# Patient Record
Sex: Female | Born: 1985 | Race: Black or African American | Hispanic: No | Marital: Single | Smoking: Never smoker
Health system: Southern US, Community
[De-identification: ages and names within clinical notes are randomized; demographics above are authoritative.]

## PROBLEM LIST (undated history)

## (undated) DIAGNOSIS — R519 Headache, unspecified: Secondary | ICD-10-CM

## (undated) DIAGNOSIS — R51 Headache: Secondary | ICD-10-CM

## (undated) HISTORY — DX: Headache, unspecified: R51.9

## (undated) HISTORY — DX: Headache: R51

---
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
## 2005-01-05 ENCOUNTER — Emergency Department (HOSPITAL_COMMUNITY): Admission: EM | Admit: 2005-01-05 | Discharge: 2005-01-05 | Payer: Self-pay | Admitting: Emergency Medicine

## 2008-09-05 ENCOUNTER — Other Ambulatory Visit: Admission: RE | Admit: 2008-09-05 | Discharge: 2008-09-05 | Payer: Self-pay | Admitting: Obstetrics and Gynecology

## 2009-01-19 ENCOUNTER — Ambulatory Visit: Payer: Self-pay | Admitting: Obstetrics & Gynecology

## 2009-01-19 ENCOUNTER — Inpatient Hospital Stay (HOSPITAL_COMMUNITY): Admission: AD | Admit: 2009-01-19 | Discharge: 2009-01-21 | Payer: Self-pay | Admitting: Obstetrics and Gynecology

## 2009-09-21 ENCOUNTER — Other Ambulatory Visit: Admission: RE | Admit: 2009-09-21 | Discharge: 2009-09-21 | Payer: Self-pay | Admitting: Obstetrics and Gynecology

## 2010-06-17 ENCOUNTER — Encounter: Payer: Self-pay | Admitting: Family Medicine

## 2010-09-01 LAB — CBC
HCT: 33.3 % — ABNORMAL LOW (ref 36.0–46.0)
Hemoglobin: 10.8 g/dL — ABNORMAL LOW (ref 12.0–15.0)
Hemoglobin: 11.2 g/dL — ABNORMAL LOW (ref 12.0–15.0)
MCHC: 32.5 g/dL (ref 30.0–36.0)
MCV: 87.9 fL (ref 78.0–100.0)
Platelets: 203 10*3/uL (ref 150–400)
RBC: 3.92 MIL/uL (ref 3.87–5.11)
RDW: 13.5 % (ref 11.5–15.5)
WBC: 23 10*3/uL — ABNORMAL HIGH (ref 4.0–10.5)

## 2011-01-09 ENCOUNTER — Encounter: Payer: Self-pay | Admitting: *Deleted

## 2011-01-09 ENCOUNTER — Emergency Department (HOSPITAL_COMMUNITY)
Admission: EM | Admit: 2011-01-09 | Discharge: 2011-01-09 | Disposition: A | Discharge status: Home / Self Care | Payer: Medicaid Other | Attending: Emergency Medicine | Admitting: Emergency Medicine

## 2011-01-09 DIAGNOSIS — G43909 Migraine, unspecified, not intractable, without status migrainosus: Secondary | ICD-10-CM | POA: Insufficient documentation

## 2011-01-09 MED ORDER — DIPHENHYDRAMINE HCL 50 MG/ML IJ SOLN
50.0000 mg | Freq: Once | INTRAMUSCULAR | Status: AC
  Administered 2011-01-09: 50 mg via INTRAMUSCULAR
  Filled 2011-01-09: qty 1

## 2011-01-09 MED ORDER — KETOROLAC TROMETHAMINE 60 MG/2ML IM SOLN
60.0000 mg | Freq: Once | INTRAMUSCULAR | Status: AC
  Administered 2011-01-09: 60 mg via INTRAMUSCULAR
  Filled 2011-01-09: qty 2

## 2011-01-09 MED ORDER — METOCLOPRAMIDE HCL 5 MG/ML IJ SOLN
10.0000 mg | Freq: Once | INTRAMUSCULAR | Status: AC
  Administered 2011-01-09: 10 mg via INTRAMUSCULAR
  Filled 2011-01-09: qty 2

## 2011-01-09 NOTE — ED Notes (Signed)
Pt reports hx of migraines and tonight she has been unable to tolerate her headache pain

## 2011-01-09 NOTE — ED Provider Notes (Signed)
History     CSN: [id number] Arrival date & time: 01/09/2011  9:46 PM  Chief Complaint  Patient presents with  . Headache   HPI Pt was seen at 2205. Per pt, c/o gradual onset and persistence of constant acute flair of her chronic migraine headache x2 days.  Has been assoc with nausea.  Describes the headache as per her usual chronic migraine headache pain pattern x2 years.  Denies headache was sudden or maximal in onset or at any time.  Denies visual changes, no focal motor weakness, no tingling/numbness in extremities, no fevers, no neck pain, no rash.     Past Medical History  Diagnosis Date  . Migraine     History reviewed. No pertinent past surgical history.  No family history on file.  History  Substance Use Topics  . Smoking status: Never Smoker   . Smokeless tobacco: Not on file  . Alcohol Use: No    OB History    Grav Para Term Preterm Abortions TAB SAB Ect Mult Living                  Review of Systems ROS: Statement: All systems negative except as marked or noted in the HPI; Constitutional: Negative for fever and chills. ; ; Eyes: Negative for eye pain, redness and discharge. ; ; ENMT: Negative for ear pain, hoarseness, nasal congestion, sinus pressure and sore throat. ; ; Cardiovascular: Negative for chest pain, palpitations, diaphoresis, dyspnea and peripheral edema. ; ; Respiratory: Negative for cough, wheezing and stridor. ; ; Gastrointestinal: +nausea.  Negative for vomiting, diarrhea and abdominal pain, blood in stool, hematemesis, jaundice and rectal bleeding. . ; ; Genitourinary: Negative for dysuria, flank pain and hematuria. ; ; Musculoskeletal: Negative for back pain and neck pain. Negative for swelling and trauma.; ; Skin: Negative for pruritus, rash, abrasions, blisters, bruising and skin lesion.; ; Neuro: +migraine headache.  Negative for lightheadedness and neck stiffness. Negative for weakness, altered level of consciousness , altered mental status,  extremity weakness, paresthesias, involuntary movement, seizure and syncope.       Physical Exam  BP 122/91  Pulse 82  Temp(Src) 98.3 F (36.8 C) (Oral)  Resp 20  Ht 5\' 2"  (1.575 m)  Wt 187 lb (84.823 kg)  BMI 34.20 kg/m2  SpO2 100%  LMP 12/21/2010  Physical Exam 2210: Physical examination:  Nursing notes reviewed; Vital signs and O2 SAT reviewed;  Constitutional: Well developed, Well nourished, Well hydrated, In no acute distress; Head:  Normocephalic, atraumatic; Eyes: EOMI, PERRL, No scleral icterus; ENMT: TM's clear bilat, Mouth and pharynx normal, Mucous membranes moist; Neck: Supple, Full range of motion, No lymphadenopathy, no meningeal signs; Cardiovascular: Regular rate and rhythm, No murmur, rub, or gallop; Respiratory: Breath sounds clear & equal bilaterally, No rales, rhonchi, wheezes, or rub, Normal respiratory effort/excursion; Chest: Nontender, Movement normal; Abdomen: Soft, Nontender, Nondistended, Normal bowel sounds;  Extremities: Pulses normal, No tenderness, No edema, No calf edema or asymmetry.; Neuro: AA&Ox3, Speech clear, no facial droop.  Major CN grossly intact.  No gross focal motor or sensory deficits in extremities.  Gait steady.; Skin: Color normal, Warm, Dry.   ED Course  Procedures  MDM MDM Reviewed: nursing note and vitals     Usual migraine h/a.  Encouraged to keep a headache diary, f/u PMD.  Will tx IM meds.   Lukah Goswami Allison Quarry, DO 01/10/11 1927

## 2011-12-11 DIAGNOSIS — S61409A Unspecified open wound of unspecified hand, initial encounter: Secondary | ICD-10-CM | POA: Insufficient documentation

## 2011-12-11 DIAGNOSIS — W268XXA Contact with other sharp object(s), not elsewhere classified, initial encounter: Secondary | ICD-10-CM | POA: Insufficient documentation

## 2011-12-12 ENCOUNTER — Emergency Department (HOSPITAL_COMMUNITY)
Admission: EM | Admit: 2011-12-12 | Discharge: 2011-12-12 | Disposition: A | Discharge status: Home / Self Care | Payer: Medicaid Other | Attending: Emergency Medicine | Admitting: Emergency Medicine

## 2011-12-12 ENCOUNTER — Encounter (HOSPITAL_COMMUNITY): Payer: Self-pay | Admitting: Emergency Medicine

## 2011-12-12 DIAGNOSIS — IMO0002 Reserved for concepts with insufficient information to code with codable children: Secondary | ICD-10-CM

## 2011-12-12 MED ORDER — TETANUS-DIPHTH-ACELL PERTUSSIS 5-2.5-18.5 LF-MCG/0.5 IM SUSP
0.5000 mL | Freq: Once | INTRAMUSCULAR | Status: AC
  Administered 2011-12-12: 0.5 mL via INTRAMUSCULAR
  Filled 2011-12-12: qty 0.5

## 2011-12-12 NOTE — ED Notes (Signed)
No adverse reaction noted to tetanus shot.

## 2011-12-12 NOTE — ED Provider Notes (Addendum)
History     CSN: [id number]  Arrival date & time 12/11/11  2355   First MD Initiated Contact with Patient 12/12/11 0101      Chief Complaint  Patient presents with  . Laceration    (Consider location/radiation/quality/duration/timing/severity/associated sxs/prior treatment) HPI  Tina Larsen is a 26 y.o. female who presents to the Emergency Department complaining of laceration to right secondt MCP from a glass that broke while washing. Bleeding has been controlled.   Past Medical History  Diagnosis Date  . Migraine     No past surgical history on file.  No family history on file.  History  Substance Use Topics  . Smoking status: Never Smoker   . Smokeless tobacco: Not on file  . Alcohol Use: No    OB History    Grav Para Term Preterm Abortions TAB SAB Ect Mult Living                  Review of Systems  Constitutional: Negative for fever.       10 Systems reviewed and are negative for acute change except as noted in the HPI.  HENT: Negative for congestion.   Eyes: Negative for discharge and redness.  Respiratory: Negative for cough and shortness of breath.   Cardiovascular: Negative for chest pain.  Gastrointestinal: Negative for vomiting and abdominal pain.  Musculoskeletal: Negative for back pain.  Skin: Negative for rash.       Laceration to2nd mcp  Neurological: Negative for syncope, numbness and headaches.  Psychiatric/Behavioral:       No behavior change.    Allergies  Review of patient's allergies indicates no known allergies.  Home Medications  No current outpatient prescriptions on file.  BP 114/70  Pulse 62  Temp 98.6 F (37 C) (Oral)  Resp 18  Ht 5\' 2"  (1.575 m)  Wt 190 lb (86.183 kg)  BMI 34.75 kg/m2  SpO2 98%  LMP 11/10/2011  Physical Exam  Nursing note and vitals reviewed. Constitutional:       Awake, alert, nontoxic appearance.  HENT:  Head: Atraumatic.  Eyes: Right eye exhibits no discharge. Left eye exhibits no  discharge.  Neck: Neck supple.  Cardiovascular: Normal heart sounds.   Pulmonary/Chest: Effort normal and breath sounds normal. She exhibits no tenderness.  Abdominal: Soft. There is no tenderness. There is no rebound.  Musculoskeletal: She exhibits no tenderness.       Baseline ROM, no obvious new focal weakness.  Neurological:       Mental status and motor strength appears baseline for patient and situation.  Skin: No rash noted.       Flap laceration  C shaped, 2 cm overlying 2nd mcp right hand. Bleeding controlled.  Psychiatric: She has a normal mood and affect.    ED Course  Procedures (including critical care time)  LACERATION REPAIR Performed by: Annamarie Dawley. Authorized by: Annamarie Dawley Consent: Verbal consent obtained. Risks and benefits: risks, benefits and alternatives were discussed Consent given by: patient Patient identity confirmed: provided demographic data Prepped and Draped in normal sterile fashion Wound explored  Laceration Location: 2nd mcp right hand Laceration Length: 2cm No Foreign Bodies seen or palpated AIrrigation method: syringe Amount of cleaning: standard Skin closure: dermabond and steri strips  Patient tolerance: Patient tolerated the procedure well with no immediate complications.  MDM  Patient with flap laceration to right 2nd mcp from a broken drinking glass. Patient was given tetanus update. Laceration repaired with dermabond and steri strips. Pt  stable in ED with no significant deterioration in condition.The patient appears reasonably screened and/or stabilized for discharge and I doubt any other medical condition or other [facility] requiring further screening, evaluation, or treatment in the ED at this time prior to discharge.  MDM Reviewed: nursing note and vitals           Nicoletta Dress. Colon Branch, MD 12/12/11 1610  Nicoletta Dress. Colon Branch, MD 12/12/11 [phone]

## 2011-12-12 NOTE — ED Notes (Signed)
Patient states she was washing a glass and it broke; presents to ER with laceration to knuckle of right index finger.

## 2014-07-20 ENCOUNTER — Emergency Department (HOSPITAL_COMMUNITY)
Admission: EM | Admit: 2014-07-20 | Discharge: 2014-07-20 | Discharge status: Against Medical Advice | Payer: Medicaid Other | Attending: Emergency Medicine | Admitting: Emergency Medicine

## 2014-07-20 ENCOUNTER — Emergency Department (HOSPITAL_COMMUNITY): Payer: Medicaid Other

## 2014-07-20 ENCOUNTER — Encounter (HOSPITAL_COMMUNITY): Payer: Self-pay | Admitting: *Deleted

## 2014-07-20 DIAGNOSIS — Y92410 Unspecified street and highway as the place of occurrence of the external cause: Secondary | ICD-10-CM | POA: Insufficient documentation

## 2014-07-20 DIAGNOSIS — S3991XA Unspecified injury of abdomen, initial encounter: Secondary | ICD-10-CM | POA: Insufficient documentation

## 2014-07-20 DIAGNOSIS — Y998 Other external cause status: Secondary | ICD-10-CM | POA: Insufficient documentation

## 2014-07-20 DIAGNOSIS — Y9389 Activity, other specified: Secondary | ICD-10-CM | POA: Insufficient documentation

## 2014-07-20 DIAGNOSIS — Z8659 Personal history of other mental and behavioral disorders: Secondary | ICD-10-CM | POA: Insufficient documentation
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
## 2014-07-20 NOTE — ED Notes (Signed)
No redness or bruising noted across abdomen. Abdomen is soft w/ no rigidity or tenderness..Marland Kitchen
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
## 2014-07-20 NOTE — ED Notes (Signed)
Dr. Clarene DukeMcmanus in room to do ultrasound.  Pt not in room.

## 2015-10-05 ENCOUNTER — Ambulatory Visit: Payer: Medicaid Other | Admitting: Family Medicine

## 2015-10-12 ENCOUNTER — Ambulatory Visit (INDEPENDENT_AMBULATORY_CARE_PROVIDER_SITE_OTHER): Payer: BLUE CROSS/BLUE SHIELD | Admitting: Family Medicine

## 2015-10-12 ENCOUNTER — Encounter: Payer: Self-pay | Admitting: Family Medicine

## 2015-10-12 VITALS — BP 119/69 | HR 61 | Temp 97.9°F | Ht 63.0 in | Wt 188.0 lb

## 2015-10-12 DIAGNOSIS — G4440 Drug-induced headache, not elsewhere classified, not intractable: Secondary | ICD-10-CM

## 2015-10-12 DIAGNOSIS — M79671 Pain in right foot: Secondary | ICD-10-CM

## 2015-10-12 NOTE — Progress Notes (Signed)
[facility] [address] [place], [place] [address] [phone]  Date:  10/12/2015   Name:  Tina Larsen   DOB:  November 24, 1985   MRN:  [id number]  PCP:  Abbe Amsterdam, MD    Chief Complaint: Establish Care   History of Present Illness:  Tina Larsen is a 30 y.o. very pleasant female patient who presents with the following:  New patient here today to establish care and to discuss concern about headaches.  She has noted these for 3 years or so.  Are becoming more frequent. The HA are sometimes present when she is waking up in the morning, or in the middle of the day, but they are generally at their worst when she gets home at night.  No associated nausea or vomiting.  The pain can be right behind her right eye, or in the bitemporal or forehead areas.   She will have photosensitivity with her HA She does not have aura  She is taking 2 goody powders for her HA. She is taking these every day- she has been doing this for 3 years.    She is otherwise generally healthy  Never had head injury She has bene dx with migraine in the pst   LMP last week.  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  Working does seem to make her HA worse- but they can occur on her days off as well.    She has 1 son who is finishing up 1st grade this year.  He is doing very well She is under some stress.   Also she notes pain in her right foot for about one year at the 1st MCP.  She has noted a bump there.  NKI It hurts when she is driving the bus f   There are no active problems to display for this patient.   Past Medical History  Diagnosis Date  . Migraine   . Headache     No past surgical history on file.  Social History  Substance Use Topics  . Smoking status: Never Smoker   . Smokeless tobacco: None  . Alcohol Use: No    No family history on file.  No Known Allergies  Medication list has been reviewed and  updated.  No current outpatient prescriptions on file prior to visit.   No current facility-administered medications on file prior to visit.    Review of Systems:  As per HPI- otherwise negative.   Physical Examination: Filed Vitals:   10/12/15 1313  BP: 119/69  Pulse: 61  Temp: 97.9 F (36.6 C)   Filed Vitals:   10/12/15 1313  Height:  (1.6 m)  Weight: 188 lb (85.276 kg)   Body mass index is 33.31 kg/(m^2). Ideal Body Weight: Weight in (lb) to have BMI = 25: 140.8  GEN: WDWN, NAD, Non-toxic, A & O x 3, overweight, looks well HEENT: Atraumatic, Normocephalic. Neck supple. No masses, No LAD.  Bilateral TM wnl, oropharynx normal.  PEERL,EOMI.   Ears and Nose: No external deformity.   CV: RRR, No M/G/R. No JVD. No thrill. No extra heart sounds. PULM: CTA B, no wheezes, crackles, rhonchi. No retractions. No resp. distress. No accessory muscle use. EXTR: No c/c/e. She has tenderness and mild swelling of there right first MTP, and a small bunion NEURO Normal gait. Full neuro exam including strength, sensation and DTR of all extremities is normal.  Normal romberg  PSYCH: Normally interactive. Conversant. Not depressed or anxious appearing.  Calm demeanor.    Assessment and Plan: Rebound headache  Right foot pain - Plan: Ambulatory referral to Orthopedic Surgery  Suspect that her HA may be due to overuse of goody powder. Asked her to stop using these and change to plain ibuprofen or tylenol when needed- nothing at all if HA not severe.  She will do so and let me know if not better Will refer to ortho for her foot problem See patient instructions for more details.     Signed Abbe AmsterdamJessica Keerstin Bjelland, MD

## 2015-10-12 NOTE — Patient Instructions (Signed)
It was very nice to meet you today I suspect that your headaches may be due to rebound effect- your body looking for the caffeine that is present in the ColdironGoody powders.  Please stop using these- try plan tylenol or ibuprofen if needed for headache. If your headache is minor try to take nothing at all I hope that in 1-2 weeks this will resolve your headaches- let me know if this is not the case or if you have any other concens  Let's get together for a physical in the next few months at your convenience.

## 2015-10-12 NOTE — Progress Notes (Signed)
Pre visit review using our clinic tool,if applicable. No additional management support is needed unless otherwise documented below in the visit note.  

## 2015-11-02 ENCOUNTER — Ambulatory Visit: Payer: Medicaid Other | Admitting: Family Medicine

## 2018-06-15 ENCOUNTER — Encounter: Payer: Self-pay | Admitting: Adult Health

## 2018-06-23 ENCOUNTER — Ambulatory Visit: Payer: Commercial Managed Care - PPO | Admitting: Adult Health

## 2018-06-23 ENCOUNTER — Telehealth: Payer: Self-pay | Admitting: *Deleted

## 2018-06-23 ENCOUNTER — Encounter: Payer: Self-pay | Admitting: Adult Health

## 2018-06-23 VITALS — BP 122/79 | HR 76 | Ht 61.0 in | Wt 190.0 lb

## 2018-06-23 DIAGNOSIS — Z3201 Encounter for pregnancy test, result positive: Secondary | ICD-10-CM

## 2018-06-23 DIAGNOSIS — N926 Irregular menstruation, unspecified: Secondary | ICD-10-CM

## 2018-06-23 DIAGNOSIS — R110 Nausea: Secondary | ICD-10-CM | POA: Diagnosis not present

## 2018-06-23 DIAGNOSIS — O3680X0 Pregnancy with inconclusive fetal viability, not applicable or unspecified: Secondary | ICD-10-CM | POA: Insufficient documentation

## 2018-06-23 DIAGNOSIS — Z3A11 11 weeks gestation of pregnancy: Secondary | ICD-10-CM

## 2018-06-23 LAB — POCT URINE PREGNANCY: PREG TEST UR: POSITIVE — AB

## 2018-06-23 MED ORDER — PRENATE MINI 18-0.6-0.4-350 MG PO CAPS: 1.0000 | ORAL_CAPSULE | Freq: Every day | ORAL | 0 refills | Status: DC

## 2018-06-23 NOTE — Patient Instructions (Signed)
First Trimester of Pregnancy  The first trimester of pregnancy is from week 1 until the end of week 13 (months 1 through 3). A week after a sperm fertilizes an egg, the egg will implant on the wall of the uterus. This embryo will begin to develop into a baby. Genes from you and your partner will form the baby. The female genes will determine whether the baby will be a boy or a girl. At 6-8 weeks, the eyes and face will be formed, and the heartbeat can be seen on ultrasound. At the end of 12 weeks, all the baby's organs will be formed.  Now that you are pregnant, you will want to do everything you can to have a healthy baby. Two of the most important things are to get good prenatal care and to follow your health care provider's instructions. Prenatal care is all the medical care you receive before the baby's birth. This care will help prevent, find, and treat any problems during the pregnancy and childbirth.  Body changes during your first trimester  Your body goes through many changes during pregnancy. The changes vary from woman to woman.   You may gain or lose a couple of pounds at first.   You may feel sick to your stomach (nauseous) and you may throw up (vomit). If the vomiting is uncontrollable, call your health care provider.   You may tire easily.   You may develop headaches that can be relieved by medicines. All medicines should be approved by your health care provider.   You may urinate more often. Painful urination may mean you have a bladder infection.   You may develop heartburn as a result of your pregnancy.   You may develop constipation because certain hormones are causing the muscles that push stool through your intestines to slow down.   You may develop hemorrhoids or swollen veins (varicose veins).   Your breasts may begin to grow larger and become tender. Your nipples may stick out more, and the tissue that surrounds them (areola) may become darker.   Your gums may bleed and may be  sensitive to brushing and flossing.   Dark spots or blotches (chloasma, mask of pregnancy) may develop on your face. This will likely fade after the baby is born.   Your menstrual periods will stop.   You may have a loss of appetite.   You may develop cravings for certain kinds of food.   You may have changes in your emotions from day to day, such as being excited to be pregnant or being concerned that something may go wrong with the pregnancy and baby.   You may have more vivid and strange dreams.   You may have changes in your hair. These can include thickening of your hair, rapid growth, and changes in texture. Some women also have hair loss during or after pregnancy, or hair that feels dry or thin. Your hair will most likely return to normal after your baby is born.  What to expect at prenatal visits  During a routine prenatal visit:   You will be weighed to make sure you and the baby are growing normally.   Your blood pressure will be taken.   Your abdomen will be measured to track your baby's growth.   The fetal heartbeat will be listened to between weeks 10 and 14 of your pregnancy.   Test results from any previous visits will be discussed.  Your health care provider may ask you:     How you are feeling.   If you are feeling the baby move.   If you have had any abnormal symptoms, such as leaking fluid, bleeding, severe headaches, or abdominal cramping.   If you are using any tobacco products, including cigarettes, chewing tobacco, and electronic cigarettes.   If you have any questions.  Other tests that may be performed during your first trimester include:   Blood tests to find your blood type and to check for the presence of any previous infections. The tests will also be used to check for low iron levels (anemia) and protein on red blood cells (Rh antibodies). Depending on your risk factors, or if you previously had diabetes during pregnancy, you may have tests to check for high blood sugar  that affects pregnant women (gestational diabetes).   Urine tests to check for infections, diabetes, or protein in the urine.   An ultrasound to confirm the proper growth and development of the baby.   Fetal screens for spinal cord problems (spina bifida) and Down syndrome.   HIV (human immunodeficiency virus) testing. Routine prenatal testing includes screening for HIV, unless you choose not to have this test.   You may need other tests to make sure you and the baby are doing well.  Follow these instructions at home:  Medicines   Follow your health care provider's instructions regarding medicine use. Specific medicines may be either safe or unsafe to take during pregnancy.   Take a prenatal vitamin that contains at least 600 micrograms (mcg) of folic acid.   If you develop constipation, try taking a stool softener if your health care provider approves.  Eating and drinking     Eat a balanced diet that includes fresh fruits and vegetables, whole grains, good sources of protein such as meat, eggs, or tofu, and low-fat dairy. Your health care provider will help you determine the amount of weight gain that is right for you.   Avoid raw meat and uncooked cheese. These carry germs that can cause birth defects in the baby.   Eating four or five small meals rather than three large meals a day may help relieve nausea and vomiting. If you start to feel nauseous, eating a few soda crackers can be helpful. Drinking liquids between meals, instead of during meals, also seems to help ease nausea and vomiting.   Limit foods that are high in fat and processed sugars, such as fried and sweet foods.   To prevent constipation:  ? Eat foods that are high in fiber, such as fresh fruits and vegetables, whole grains, and beans.  ? Drink enough fluid to keep your urine clear or pale yellow.  Activity   Exercise only as directed by your health care provider. Most women can continue their usual exercise routine during  pregnancy. Try to exercise for 30 minutes at least 5 days a week. Exercising will help you:  ? Control your weight.  ? Stay in shape.  ? Be prepared for labor and delivery.   Experiencing pain or cramping in the lower abdomen or lower back is a good sign that you should stop exercising. Check with your health care provider before continuing with normal exercises.   Try to avoid standing for long periods of time. Move your legs often if you must stand in one place for a long time.   Avoid heavy lifting.   Wear low-heeled shoes and practice good posture.   You may continue to have sex unless your health care   provider tells you not to.  Relieving pain and discomfort   Wear a good support bra to relieve breast tenderness.   Take warm sitz baths to soothe any pain or discomfort caused by hemorrhoids. Use hemorrhoid cream if your health care provider approves.   Rest with your legs elevated if you have leg cramps or low back pain.   If you develop varicose veins in your legs, wear support hose. Elevate your feet for 15 minutes, 3-4 times a day. Limit salt in your diet.  Prenatal care   Schedule your prenatal visits by the twelfth week of pregnancy. They are usually scheduled monthly at first, then more often in the last 2 months before delivery.   Write down your questions. Take them to your prenatal visits.   Keep all your prenatal visits as told by your health care provider. This is important.  Safety   Wear your seat belt at all times when driving.   Make a list of emergency phone numbers, including numbers for family, friends, the hospital, and police and fire departments.  General instructions   Ask your health care provider for a referral to a local prenatal education class. Begin classes no later than the beginning of month 6 of your pregnancy.   Ask for help if you have counseling or nutritional needs during pregnancy. Your health care provider can offer advice or refer you to specialists for help  with various needs.   Do not use hot tubs, steam rooms, or saunas.   Do not douche or use tampons or scented sanitary pads.   Do not cross your legs for long periods of time.   Avoid cat litter boxes and soil used by cats. These carry germs that can cause birth defects in the baby and possibly loss of the fetus by miscarriage or stillbirth.   Avoid all smoking, herbs, alcohol, and medicines not prescribed by your health care provider. Chemicals in these products affect the formation and growth of the baby.   Do not use any products that contain nicotine or tobacco, such as cigarettes and e-cigarettes. If you need help quitting, ask your health care provider. You may receive counseling support and other resources to help you quit.   Schedule a dentist appointment. At home, brush your teeth with a soft toothbrush and be gentle when you floss.  Contact a health care provider if:   You have dizziness.   You have mild pelvic cramps, pelvic pressure, or nagging pain in the abdominal area.   You have persistent nausea, vomiting, or diarrhea.   You have a bad smelling vaginal discharge.   You have pain when you urinate.   You notice increased swelling in your face, hands, legs, or ankles.   You are exposed to fifth disease or chickenpox.   You are exposed to German measles (rubella) and have never had it.  Get help right away if:   You have a fever.   You are leaking fluid from your vagina.   You have spotting or bleeding from your vagina.   You have severe abdominal cramping or pain.   You have rapid weight gain or loss.   You vomit blood or material that looks like coffee grounds.   You develop a severe headache.   You have shortness of breath.   You have any kind of trauma, such as from a fall or a car accident.  Summary   The first trimester of pregnancy is from week 1 until   the end of week 13 (months 1 through 3).   Your body goes through many changes during pregnancy. The changes vary from  woman to woman.   You will have routine prenatal visits. During those visits, your health care provider will examine you, discuss any test results you may have, and talk with you about how you are feeling.  This information is not intended to replace advice given to you by your health care provider. Make sure you discuss any questions you have with your health care provider.  Document Released: 05/07/2001 Document Revised: 04/24/2016 Document Reviewed: 04/24/2016  Elsevier Interactive Patient Education  2019 [employer]

## 2018-06-23 NOTE — Progress Notes (Signed)
Patient ID: Tina Larsen, female   DOB: February 02, 1986, 33 y.o.   MRN: [id number] History of Present Illness: Tina Larsen is a 33 year old black female, single in for UPT, has missed periods and had +HPT.Has Nausea, that is better when she eats. She has 1 year old son.  She works for [employer]. PCP is Dr Patsy Lager.    Current Medications, Allergies, Past Medical History, Past Surgical History, Family History and Social History were reviewed in [facility] record.     Review of Systems:  +missed periods +nausea, esp in am   Physical Exam:BP 122/79 (BP Location: Left Arm, Patient Position: Sitting, Cuff Size: Normal)   Pulse 76   Ht 5\' 1"  (1.549 m)   Wt 190 lb (86.2 kg)   LMP 04/03/2018   BMI 35.90 kg/m   UPT +, about 11+4 weeks by LMP with EDD 01/09/2019. General:  Well developed, well nourished, no acute distress Skin:  Warm and dry Neck:  Midline trachea, normal thyroid, good ROM, no lymphadenopathy Lungs; Clear to auscultation bilaterally Cardiovascular: Regular rate and rhythm Abdomen:  Soft, non tender Psych:  No mood changes, alert and cooperative,seems happy Fall risk is low PHQ 9 score 2.  Impression: 1. Pregnancy test positive   2. [redacted] weeks gestation of pregnancy   3. Encounter to determine fetal viability of pregnancy, single or unspecified fetus       Plan: Meds ordered this encounter  Medications  . Prenat-FeCbn-FeAsp-Meth-FA-DHA (PRENATE MINI) 18-0.6-0.4-350 MG CAPS    Sig: Take 1 capsule by mouth daily.    Dispense:  33 capsule    Refill:  0    Order Specific Question:   Supervising Provider    Answer:   Duane Lope H [[id number]]  Eat often Can try B6 and unisom Return in 1 week for dating US/ 2 weeks for New OB Review handout on First trimester and by [facility]

## 2018-06-24 ENCOUNTER — Telehealth: Payer: Self-pay | Admitting: *Deleted

## 2018-06-24 MED ORDER — PRENATE MINI 18-0.6-0.4-350 MG PO CAPS: 1.0000 | ORAL_CAPSULE | Freq: Every day | ORAL | 12 refills | Status: DC

## 2018-06-24 NOTE — Telephone Encounter (Signed)
Pt came into office stating that [facility] says they never received an Rx for pnv. Pt is requesting that rx be sent to [facility] instead. Advised that I would send her request to a provider and she could check with the pharmacy later today. Pt verbalized understanding.

## 2018-06-24 NOTE — Telephone Encounter (Signed)
rx prenate mini at [facility]

## 2018-06-30 ENCOUNTER — Ambulatory Visit (INDEPENDENT_AMBULATORY_CARE_PROVIDER_SITE_OTHER): Payer: Commercial Managed Care - PPO | Admitting: Adult Health

## 2018-06-30 ENCOUNTER — Other Ambulatory Visit: Payer: Self-pay | Admitting: Adult Health

## 2018-06-30 ENCOUNTER — Encounter: Payer: Self-pay | Admitting: Adult Health

## 2018-06-30 ENCOUNTER — Ambulatory Visit (INDEPENDENT_AMBULATORY_CARE_PROVIDER_SITE_OTHER): Payer: Commercial Managed Care - PPO

## 2018-06-30 VITALS — BP 123/84 | HR 68 | Ht 61.0 in | Wt 193.0 lb

## 2018-06-30 DIAGNOSIS — O200 Threatened abortion: Secondary | ICD-10-CM

## 2018-06-30 DIAGNOSIS — O3680X0 Pregnancy with inconclusive fetal viability, not applicable or unspecified: Secondary | ICD-10-CM

## 2018-06-30 NOTE — Progress Notes (Signed)
Patient ID: Tina Larsen, female   DOB: 1985-09-16, 33 y.o.   MRN: [id number] History of Present Illness: Tina Larsen is a 33 year old black female in for dating US, and was found to have 12+4  week sac with 5+6 week fetal pole with no heart beat.   Current Medications, Allergies, Past Medical History, Past Surgical History, Family History and Social History were reviewed in [facility] electronic medical record.     Review of Systems: No bleeding    Physical Exam:BP 123/84 (BP Location: Left Arm, Patient Position: Sitting, Cuff Size: Normal)   Pulse 68   Ht 5\' 1"  (1.549 m)   Wt 193 lb (87.5 kg)   LMP 04/03/2018   BMI 36.47 kg/m  General:  Well developed, well nourished, no acute distress Skin:  Warm and dry Discussed that US showed 12+4 week sac and only 5+6 week fetal pole with no FHT, this may represent early failed pregnancy, but will recheck US in 1 week.   Impression: 1. Threatened abortion in early pregnancy       Plan: Return 2/12 for F/U US and see me  Call if any bleeding Continue PNV

## 2018-06-30 NOTE — Progress Notes (Signed)
[place] 12+5 wks GS  64.5 mm,crl 3.3 mm,no FHT,normal ovaries bilat,Jennifer discussed results w/pt

## 2018-07-07 ENCOUNTER — Ambulatory Visit: Payer: Commercial Managed Care - PPO | Admitting: *Deleted

## 2018-07-07 ENCOUNTER — Encounter: Payer: Self-pay | Admitting: Obstetrics & Gynecology

## 2018-07-07 ENCOUNTER — Other Ambulatory Visit: Payer: Self-pay

## 2018-07-07 ENCOUNTER — Ambulatory Visit (INDEPENDENT_AMBULATORY_CARE_PROVIDER_SITE_OTHER): Payer: Commercial Managed Care - PPO | Admitting: Obstetrics & Gynecology

## 2018-07-07 ENCOUNTER — Encounter: Payer: Commercial Managed Care - PPO | Admitting: Women's Health

## 2018-07-07 ENCOUNTER — Ambulatory Visit (INDEPENDENT_AMBULATORY_CARE_PROVIDER_SITE_OTHER): Payer: Commercial Managed Care - PPO

## 2018-07-07 VITALS — BP 120/79 | HR 60 | Ht 62.0 in | Wt 192.0 lb

## 2018-07-07 DIAGNOSIS — O200 Threatened abortion: Secondary | ICD-10-CM

## 2018-07-07 DIAGNOSIS — O021 Missed abortion: Secondary | ICD-10-CM

## 2018-07-07 NOTE — Progress Notes (Signed)
F/U US 5+6 wks fetal pole,crl 3.3 mm,no FHT,no growth from prior ultrasound,7.1 cm GS=13+5 wks,normal ovaries bilat

## 2018-07-09 ENCOUNTER — Encounter: Payer: Self-pay | Admitting: Obstetrics & Gynecology

## 2018-07-09 NOTE — Progress Notes (Signed)
Follow up appointment for results  Chief Complaint  Patient presents with  . Follow-up    u/s    Blood pressure 120/79, pulse 60, height 5\' 2"  (1.575 m), weight 192 lb (87.1 kg), last menstrual period 04/03/2018.  FOLLOW UP SONOGRAM   Tina Larsen is in the office for a follow up sonogram  for no FHT on previous ultrasond  She is a 33 y.o. year old G2P1 with Estimated Date of Delivery: 01/08/19 by LMP now at  [redacted]w[redacted]d weeks gestation. Thus far the pregnancy has been complicated by no fht.  GESTATION: SINGLETON   FETAL ACTIVITY:          Heart rate         No fht        CERVIX: Appears closed   ADNEXA: The ovaries are normal.   GESTATIONAL AGE AND  BIOMETRICS:  Gestational criteria: Estimated Date of Delivery: 01/08/19 by LMP now at 107w4d  Previous Scans:1  GESTATIONAL SAC           71.0 mm         13+2 weeks  CROWN RUMP LENGTH           3.33 mm         5+6 weeks                                                                               AVERAGE EGA(BY THIS SCAN):  5+6 weeks    SUSPECTED ABNORMALITIES:   yes,no fht,no growth from prior ultrasound   QUALITY OF SCAN: satisfactory  TECHNICIAN COMMENTS:  F/U US 5+6 wks fetal pole,crl 3.3 mm,no FHT,no growth from prior ultrasound,7.1 cm GS=13+5 wks,normal ovaries bilat         A copy of this report including all images has been saved and backed up to a second source for retrieval if needed. All measures and details of the anatomical scan, placentation, fluid volume and pelvic anatomy are contained in that report.  Tina Larsen 07/07/2018 11:07 AM   Missed Ab in first trimester, very large GS no embryonic progression  MEDS ordered this encounter: No orders of the defined types were placed in this encounter.   Orders for this encounter: No orders of the defined types were placed in this encounter.   Impression: 1. Missed  abortion    Plan: Pt given options of conservative management with waiting for loss vs cytotec vs D&C I recommended D&C due to her sac size  She will consider options and recontact our office  Follow Up: Return if symptoms worsen or fail to improve.       Face to face time:  15 minutes  Greater than 50% of the visit time was spent in counseling and coordination of care with the patient.  The summary and outline of the counseling and care coordination is summarized in the note above.   All questions were answered.  Past Medical History:  Diagnosis Date  . Headache   . Migraine     History reviewed. No pertinent surgical history.  OB History    Gravida  2   Para  1   Term  Preterm      AB      Living  1     SAB      TAB      Ectopic      Multiple      Live Births              No Known Allergies  Social History   Socioeconomic History  . Marital status: Single    Spouse name: Not on file  . Number of children: 1  . Years of education: Not on file  . Highest education level: Not on file  Occupational History  . Not on file  Social Needs  . Financial resource strain: Not on file  . Food insecurity:    Worry: Not on file    Inability: Not on file  . Transportation needs:    Medical: Not on file    Non-medical: Not on file  Tobacco Use  . Smoking status: Never Smoker  . Smokeless tobacco: Never Used  Substance and Sexual Activity  . Alcohol use: No  . Drug use: No  . Sexual activity: Yes    Birth control/protection: None  Lifestyle  . Physical activity:    Days per week: Not on file    Minutes per session: Not on file  . Stress: Not on file  Relationships  . Social connections:    Talks on phone: Not on file    Gets together: Not on file    Attends religious service: Not on file    Active member of club or organization: Not on file    Attends meetings of clubs or organizations: Not on file    Relationship status: Not on  file  Other Topics Concern  . Not on file  Social History Narrative  . Not on file    History reviewed. No pertinent family history.

## 2019-09-06 ENCOUNTER — Emergency Department (HOSPITAL_COMMUNITY)
Admission: EM | Admit: 2019-09-06 | Discharge: 2019-09-07 | Disposition: A | Discharge status: Home / Self Care | Payer: Commercial Managed Care - PPO | Attending: Emergency Medicine | Admitting: Emergency Medicine

## 2019-09-06 ENCOUNTER — Emergency Department (HOSPITAL_COMMUNITY): Payer: Commercial Managed Care - PPO

## 2019-09-06 DIAGNOSIS — Y990 Civilian activity done for income or pay: Secondary | ICD-10-CM | POA: Diagnosis not present

## 2019-09-06 DIAGNOSIS — S93492A Sprain of other ligament of left ankle, initial encounter: Secondary | ICD-10-CM | POA: Insufficient documentation

## 2019-09-06 DIAGNOSIS — Y92811 Bus as the place of occurrence of the external cause: Secondary | ICD-10-CM | POA: Insufficient documentation

## 2019-09-06 DIAGNOSIS — X500XXA Overexertion from strenuous movement or load, initial encounter: Secondary | ICD-10-CM | POA: Diagnosis not present

## 2019-09-06 DIAGNOSIS — Y93F9 Activity, other caregiving: Secondary | ICD-10-CM | POA: Insufficient documentation

## 2019-09-06 DIAGNOSIS — S99912A Unspecified injury of left ankle, initial encounter: Secondary | ICD-10-CM | POA: Diagnosis present

## 2019-09-06 IMAGING — DX DG ANKLE COMPLETE 3+V*L*
3 series · 3 of 3 positions shown · non-contrast
Comparison: None.

CLINICAL DATA: Left ankle pain

EXAM:
LEFT ANKLE COMPLETE - 3+ VIEW

[ankle ap]
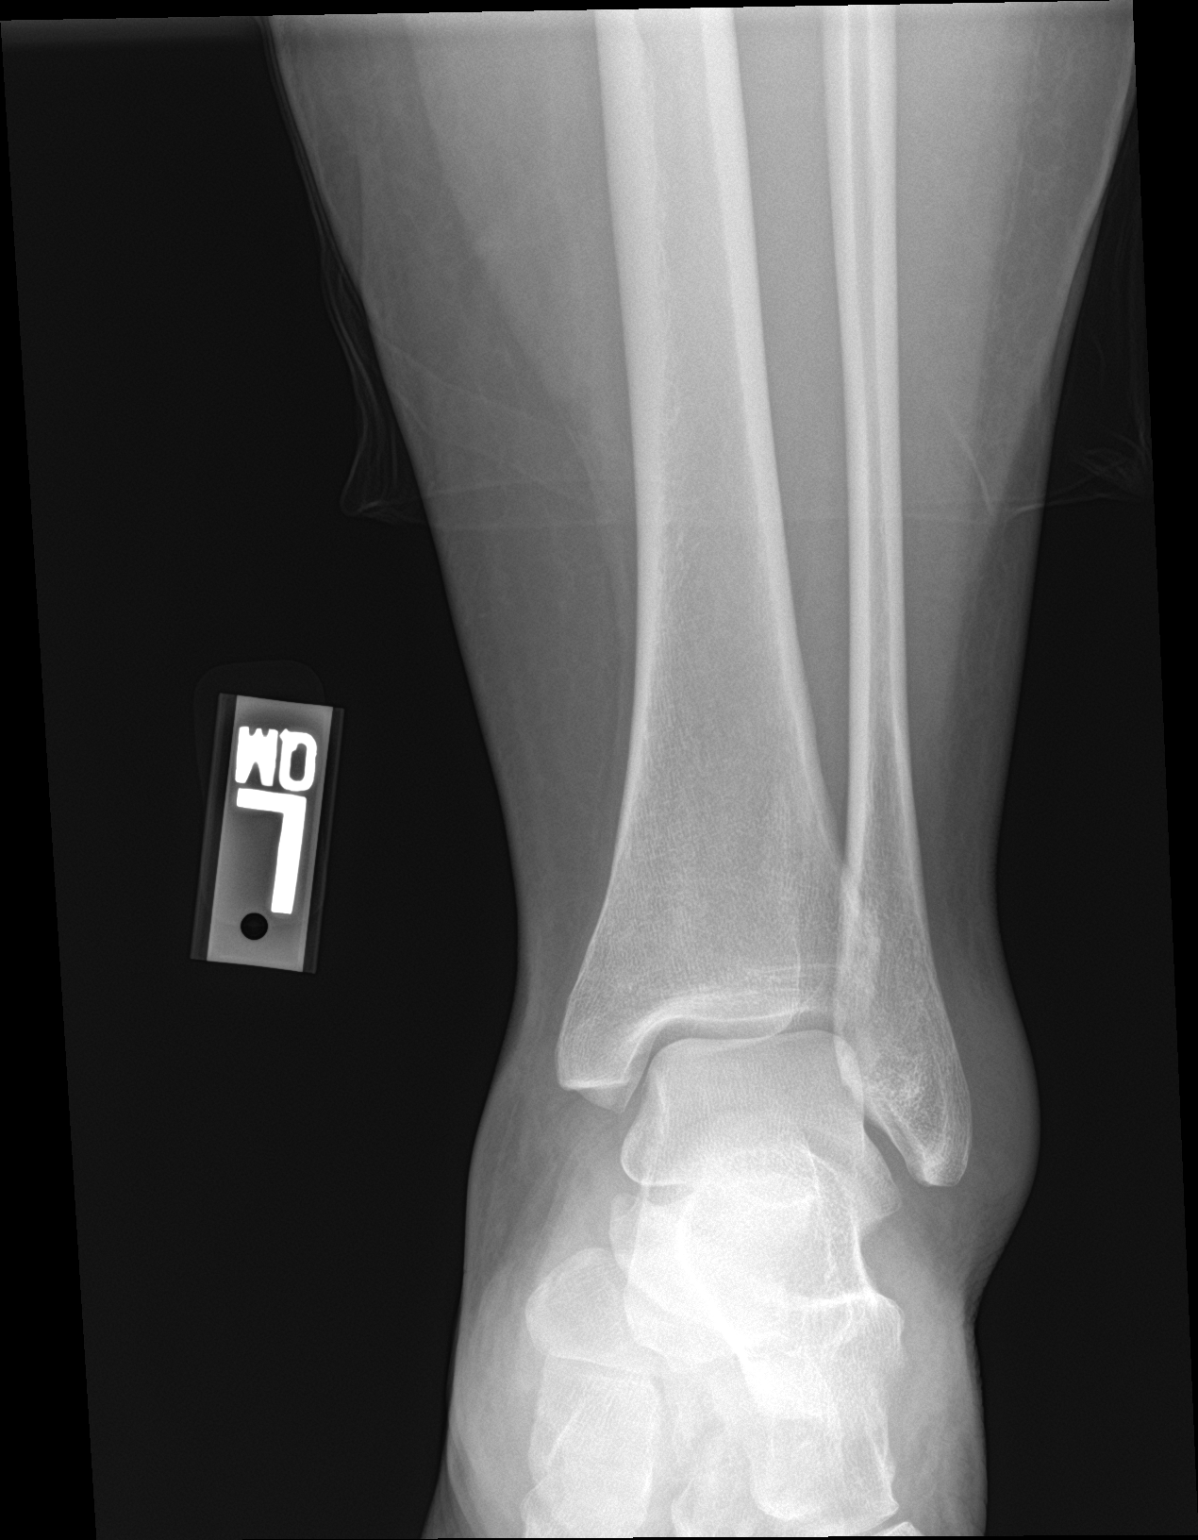

[ankle obl]
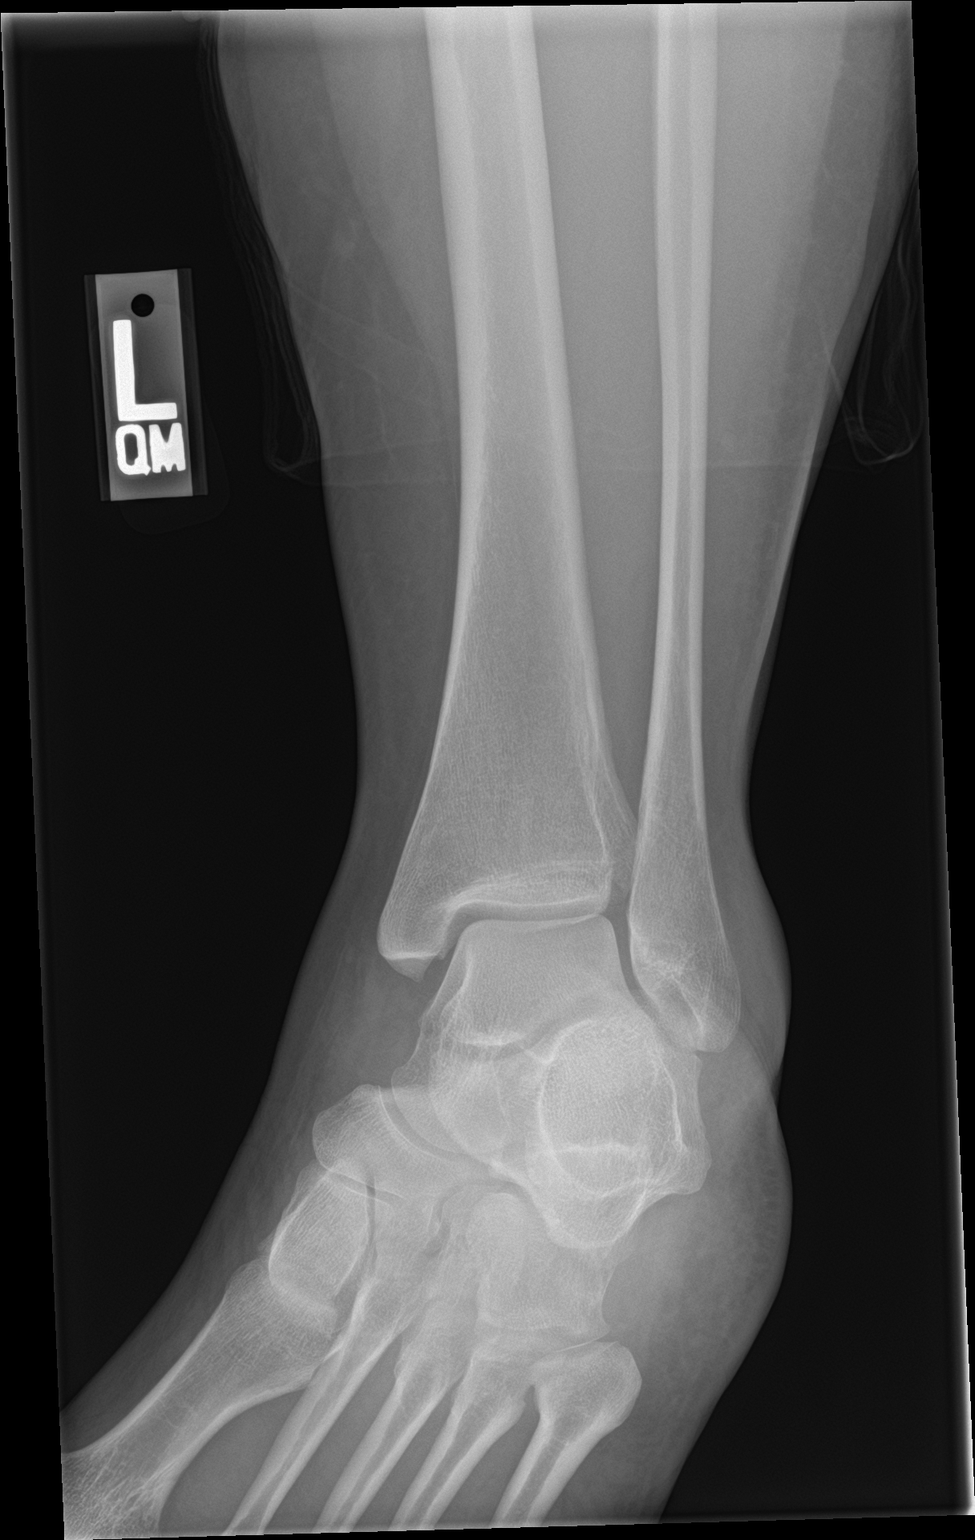

[ankle lat]
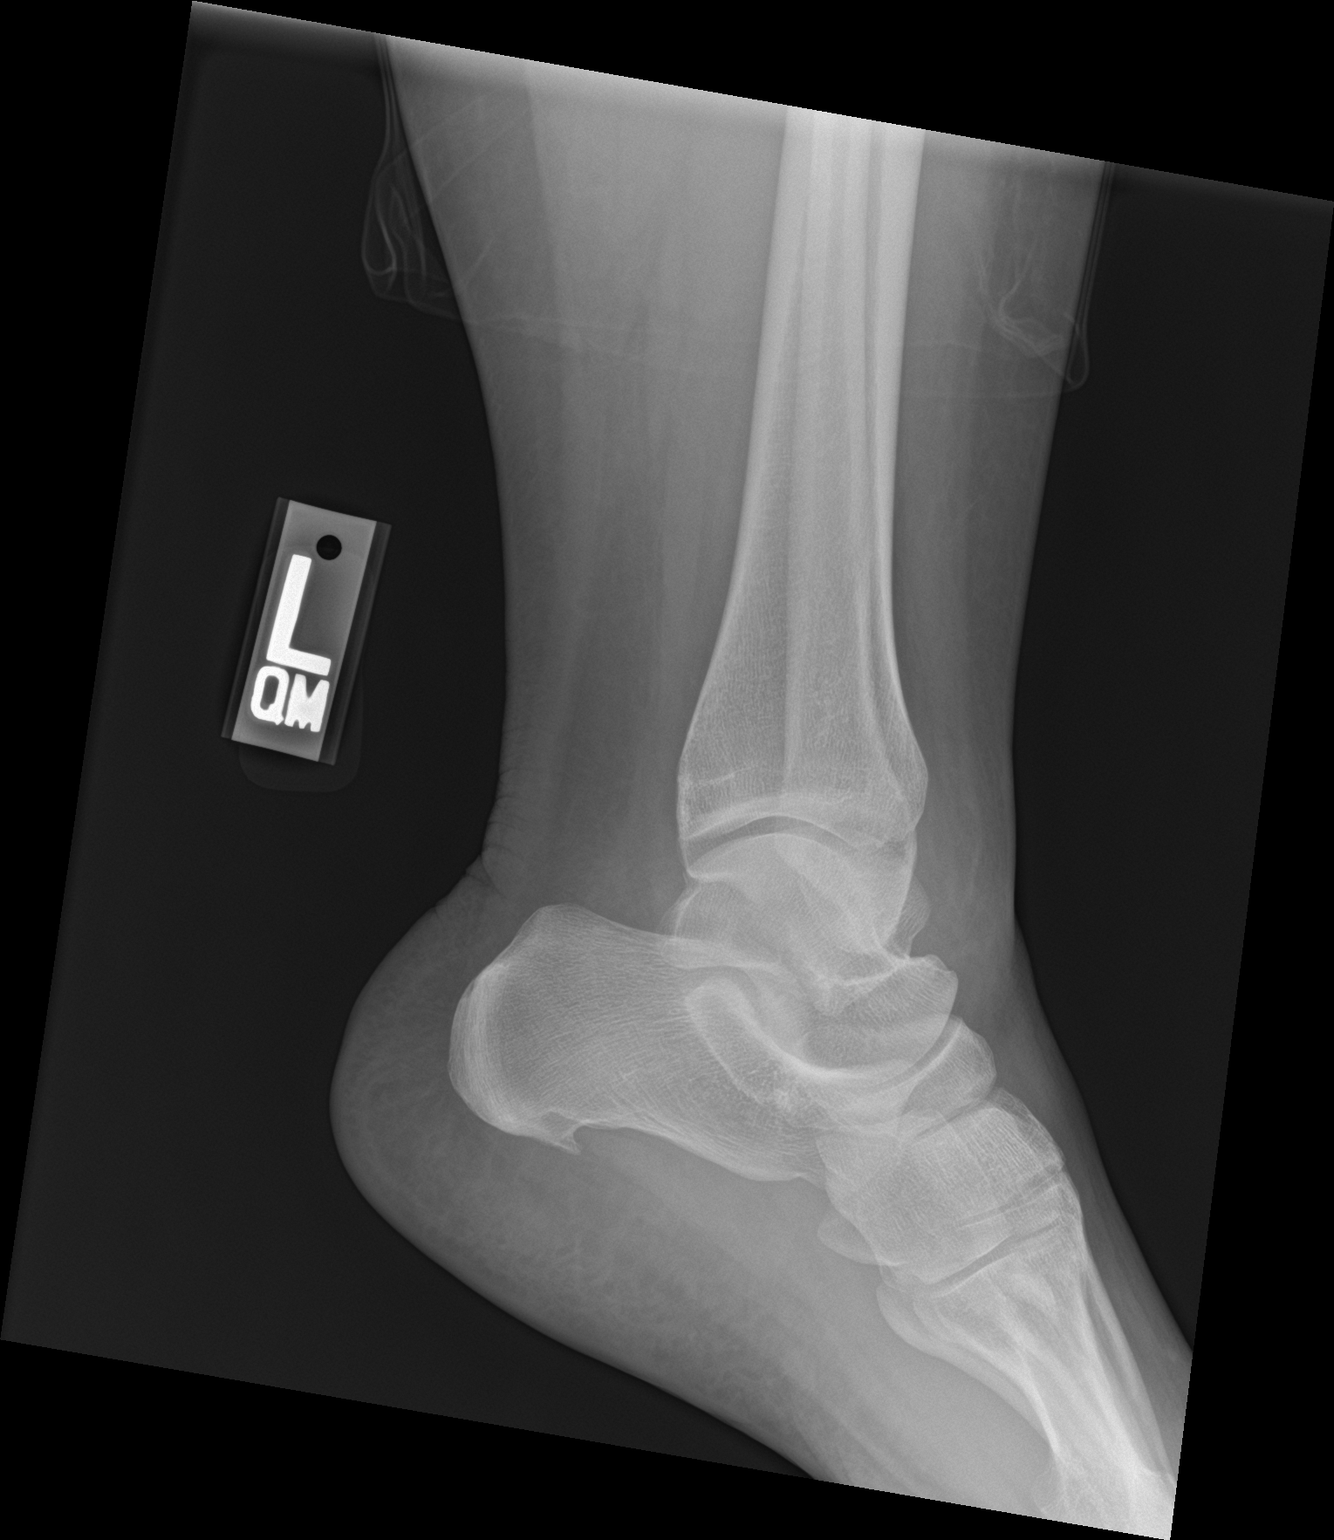

[3 of 3 positions shown; findings below may reference images not displayed]

FINDINGS: There is no evidence of fracture, dislocation, or joint effusion.
There is no evidence of arthropathy or other focal bone abnormality.
Lateral soft tissue swelling.
IMPRESSION: Lateral soft tissue swelling of the left ankle without fracture or
dislocation.

## 2019-09-06 MED ORDER — MELOXICAM 15 MG PO TABS: 15.0000 mg | ORAL_TABLET | Freq: Every day | ORAL | 1 refills | Status: DC

## 2019-09-06 NOTE — Discharge Instructions (Addendum)
Ice, tylenol and motrin for pain as needed. Gradually increase walking as tolerated.

## 2019-09-06 NOTE — ED Provider Notes (Signed)
Emergency Department Provider Note  ____________________________________________  Time seen: Approximately 11:28 PM  I have reviewed the triage vital signs and the nursing notes.   HISTORY  Chief Complaint Ankle Pain   Historian Patient     HPI Tina Larsen is a 34 y.o. female presents to the emergency department with acute left ankle pain.  Patient sustained an inversion type ankle injury while helping a passenger get off the bus.  Patient did not hit her head or neck during injury.  She denies prior history of left ankle sprains.  She has been able to bear weight since injury occurred but states that she does have pain.  No numbness or tingling in the left lower extremity.  No other alleviating measures have been attempted.    Past Medical History:  Diagnosis Date  . Headache   . Migraine      Immunizations up to date:  Yes.     Past Medical History:  Diagnosis Date  . Headache   . Migraine     Patient Active Problem List   Diagnosis Date Noted  . Encounter to determine fetal viability of pregnancy 06/23/2018  . [redacted] weeks gestation of pregnancy 06/23/2018  . Pregnancy test positive 06/23/2018    No past surgical history on file.  Prior to Admission medications   Medication Sig Start Date End Date Taking? Authorizing Provider  meloxicam (MOBIC) 15 MG tablet Take 1 tablet (15 mg total) by mouth daily. 09/06/19   Vallarie Mare M, PA-C  Prenat-FeCbn-FeAsp-Meth-FA-DHA (PRENATE MINI) 18-0.6-0.4-350 MG CAPS Take 1 capsule by mouth daily. 06/24/18   Estill Dooms, NP    Allergies Patient has no known allergies.  No family history on file.  Social History Social History   Tobacco Use  . Smoking status: Never Smoker  . Smokeless tobacco: Never Used  Substance Use Topics  . Alcohol use: No  . Drug use: No     Review of Systems  Constitutional: No fever/chills Eyes:  No discharge ENT: No upper respiratory complaints. Respiratory: no cough. No  SOB/ use of accessory muscles to breath Gastrointestinal:   No nausea, no vomiting.  No diarrhea.  No constipation. Musculoskeletal: Patient has left ankle pain.  Skin: Negative for rash, abrasions, lacerations, ecchymosis.    ____________________________________________   PHYSICAL EXAM:  VITAL SIGNS: ED Triage Vitals  Enc Vitals Group     BP 09/06/19 2142 (!) 141/81     Pulse Rate 09/06/19 2142 97     Resp 09/06/19 2142 18     Temp 09/06/19 2142 98.1 F (36.7 C)     Temp Source 09/06/19 2142 Oral     SpO2 09/06/19 2142 100 %     Weight 09/06/19 2142 200 lb (90.7 kg)     Height 09/06/19 2142 5\' 2"  (1.575 m)     Head Circumference --      Peak Flow --      Pain Score 09/06/19 2144 6     Pain Loc --      Pain Edu? --      Excl. in [place]? --      Constitutional: Alert and oriented. Well appearing and in no acute distress. Eyes: Conjunctivae are normal. PERRL. EOMI. Head: Atraumatic. Cardiovascular: Normal rate, regular rhythm. Normal S1 and S2.  Good peripheral circulation. Respiratory: Normal respiratory effort without tachypnea or retractions. Lungs CTAB. Good air entry to the bases with no decreased or absent breath sounds Gastrointestinal: Bowel sounds x 4 quadrants. Soft and  nontender to palpation. No guarding or rigidity. No distention. Musculoskeletal: Patient is able to perform limited range of motion at the left ankle, likely secondary to pain.  She has significant soft tissue swelling and tenderness to palpation over anterior talofibular ligament.  She is able to move all 5 left toes.  Palpable dorsalis pedis pulse, left. Neurologic:  Normal for age. No gross focal neurologic deficits are appreciated.  Skin:  Skin is warm, dry and intact. No rash noted. Psychiatric: Mood and affect are normal for age. Speech and behavior are normal.   ____________________________________________   LABS (all labs ordered are listed, but only abnormal results are displayed)  Labs  Reviewed - No data to display ____________________________________________  EKG   ____________________________________________  RADIOLOGY Geraldo Pitter, personally viewed and evaluated these images (plain radiographs) as part of my medical decision making, as well as reviewing the written report by the radiologist.  DG Ankle Complete Left  Result Date: 09/06/2019 CLINICAL DATA:  Left ankle pain EXAM: LEFT ANKLE COMPLETE - 3+ VIEW COMPARISON:  None. FINDINGS: There is no evidence of fracture, dislocation, or joint effusion. There is no evidence of arthropathy or other focal bone abnormality. Lateral soft tissue swelling. IMPRESSION: Lateral soft tissue swelling of the left ankle without fracture or dislocation. Electronically Signed   By: Deatra Robinson M.D.   On: 09/06/2019 22:20    ____________________________________________    PROCEDURES  Procedure(s) performed:     Procedures     Medications - No data to display   ____________________________________________   INITIAL IMPRESSION / ASSESSMENT AND PLAN / ED COURSE  Pertinent labs & imaging results that were available during my care of the patient were reviewed by me and considered in my medical decision making (see chart for details).      Assessment and plan Left ankle pain 34 year old female presents to the emergency department with acute left ankle pain after an inversion type ankle injury.  Vital signs were reviewed at triage and were reassuring.  On physical exam, patient was able to perform limited range of motion at the left ankle and she had soft tissue swelling over the anterior talofibular ligament.  X-rays of the left ankle were reviewed and revealed no acute bony abnormality.  Recommended rest, ice, compression elevation.  An Ace wrap was applied in the emergency department and crutches were provided.  Patient was discharged with meloxicam for pain and inflammation.  She was advised to follow-up with  podiatry if her symptoms do not improve over the next several weeks.   ____________________________________________  FINAL CLINICAL IMPRESSION(S) / ED DIAGNOSES  Final diagnoses:  Sprain of anterior talofibular ligament of left ankle, initial encounter      NEW MEDICATIONS STARTED DURING THIS VISIT:  ED Discharge Orders         Ordered    meloxicam (MOBIC) 15 MG tablet  Daily     09/06/19 2341              This chart was dictated using voice recognition software/Dragon. Despite best efforts to proofread, errors can occur which can change the meaning. Any change was purely unintentional.     Orvil Feil, PA-C 09/07/19 0017    Niel Hummer, MD 09/07/19 [phone]

## 2019-09-06 NOTE — ED Triage Notes (Signed)
Pt in POV, reports L ankle pain after falling off of step getting out of bus. Ambulatory.

## 2019-09-06 NOTE — Progress Notes (Signed)
Orthopedic Tech Progress Note Patient Details:  Tina Larsen 1985-10-10 [id number]  Ortho Devices Type of Ortho Device: Ace wrap Ortho Device/Splint Location: LLE Ortho Device/Splint Interventions: Application   Post Interventions Patient Tolerated: Well Instructions Provided: Care of device, Adjustment of device   Jenisse Vullo E Adham Johnson 09/06/2019, 11:44 PM

## 2020-05-27 NOTE — L&D Delivery Note (Signed)
Delivery Note [facility] pt.    Patient Name: Tina Larsen DOB: Dec 30, 1985 MRN: [id number]  Date of admission: 01/18/2021 Delivering MD: Dale Stuart  Date of delivery: 01/19/21 Type of delivery: SVD  Newborn Data: Live born female  Birth Weight:   APGAR: ,   Newborn Delivery   Birth date/time: 01/19/2021 21:26:00 Delivery type: Vaginal, Spontaneous     Tina Larsen, 35 y.o., @ [redacted]w[redacted]d,  [id number], who was admitted for Tina Larsen is a 35 y.o. female G3P0101 on 8/25, at 36 weeks and 1 day presents, currently [redacted]w[redacted]d, complaining of SROM at 3 pm 8/25 clear fluid. Rupture >24 hours. Positive Fern and pooling in MAU. Pregnancy complicated by IUGR followed by MFM. Last 01/10/2021  EFW 4 lbs 6 oz at 3.4 %ile.  H/O preterm delivery at 34 weeks.( Pt declined 17P).  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 Prenatal care provided by Dr. Steva Ready with [facility]. I was called to the room when she progressed 2+ station in the second stage of labor.  She pushed for 5/min.  NICU called to be present at delivery. She delivered a viable infant, cephalic and restituted to the LOA position over an intact perineum.  A nuchal cord   was identified, loose and reduced. The baby was placed on maternal abdomen while initial step of NRP were perfmored (Dry, Stimulated, and warmed). Hat placed on baby for thermoregulation. Delayed cord clamping was performed for 2 minutes.  Cord double clamped and cut.  Cord cut by father. Apgar scores were 8 and 9. Prophylactic Pitocin was started in the third stage of labor for active management. The placenta delivered spontaneously, shultz, with a 3 vessel cord and was sent to pathology.  Inspection revealed none. An examination of the vaginal vault and cervix was free from lacerations. The uterus was firm, bleeding stable.  Umbilical artery blood gas was not sent.  There were no complications during the procedure.  Mom and baby skin to skin following delivery. Left  in stable condition.  Maternal Info: Anesthesia: Epidural Episiotomy: no Lacerations:  no Suture Repair: no Est. Blood Loss (mL):   Newborn Info:  Baby Sex: female  APGAR (1 MIN):   APGAR (5 MINS):   APGAR (10 MINS):     Mom to postpartum.  Baby to Couplet care / Skin to Skin.  Dr Normand Sloop aware.   [place], [place], NP-C 01/19/21 9:43 PM

## 2020-10-13 ENCOUNTER — Other Ambulatory Visit: Payer: Self-pay | Admitting: Obstetrics and Gynecology

## 2020-10-13 DIAGNOSIS — O365930 Maternal care for other known or suspected poor fetal growth, third trimester, not applicable or unspecified: Secondary | ICD-10-CM

## 2020-10-27 ENCOUNTER — Encounter: Payer: Self-pay | Admitting: *Deleted

## 2020-11-01 ENCOUNTER — Ambulatory Visit: Payer: Commercial Managed Care - PPO | Attending: Family Medicine

## 2020-11-01 ENCOUNTER — Other Ambulatory Visit: Payer: Self-pay

## 2020-11-29 ENCOUNTER — Ambulatory Visit (HOSPITAL_BASED_OUTPATIENT_CLINIC_OR_DEPARTMENT_OTHER): Payer: BLUE CROSS/BLUE SHIELD | Admitting: Obstetrics and Gynecology

## 2020-11-29 ENCOUNTER — Other Ambulatory Visit: Payer: Self-pay | Admitting: Obstetrics and Gynecology

## 2020-11-29 ENCOUNTER — Other Ambulatory Visit: Payer: Self-pay

## 2020-11-29 ENCOUNTER — Ambulatory Visit: Payer: BLUE CROSS/BLUE SHIELD | Attending: Obstetrics and Gynecology | Admitting: *Deleted

## 2020-11-29 ENCOUNTER — Encounter: Payer: Self-pay | Admitting: *Deleted

## 2020-11-29 ENCOUNTER — Ambulatory Visit (HOSPITAL_BASED_OUTPATIENT_CLINIC_OR_DEPARTMENT_OTHER): Payer: BLUE CROSS/BLUE SHIELD

## 2020-11-29 VITALS — BP 125/70 | HR 81

## 2020-11-29 DIAGNOSIS — N83202 Unspecified ovarian cyst, left side: Secondary | ICD-10-CM | POA: Diagnosis not present

## 2020-11-29 DIAGNOSIS — O3483 Maternal care for other abnormalities of pelvic organs, third trimester: Secondary | ICD-10-CM

## 2020-11-29 DIAGNOSIS — O365930 Maternal care for other known or suspected poor fetal growth, third trimester, not applicable or unspecified: Secondary | ICD-10-CM | POA: Diagnosis not present

## 2020-11-29 DIAGNOSIS — O09293 Supervision of pregnancy with other poor reproductive or obstetric history, third trimester: Secondary | ICD-10-CM | POA: Insufficient documentation

## 2020-11-29 DIAGNOSIS — Z363 Encounter for antenatal screening for malformations: Secondary | ICD-10-CM | POA: Diagnosis not present

## 2020-11-29 DIAGNOSIS — Z3A29 29 weeks gestation of pregnancy: Secondary | ICD-10-CM

## 2020-11-29 DIAGNOSIS — O365931 Maternal care for other known or suspected poor fetal growth, third trimester, fetus 1: Secondary | ICD-10-CM | POA: Diagnosis not present

## 2020-11-29 DIAGNOSIS — O09523 Supervision of elderly multigravida, third trimester: Secondary | ICD-10-CM

## 2020-11-29 DIAGNOSIS — O09219 Supervision of pregnancy with history of pre-term labor, unspecified trimester: Secondary | ICD-10-CM

## 2020-11-29 DIAGNOSIS — O09213 Supervision of pregnancy with history of pre-term labor, third trimester: Secondary | ICD-10-CM | POA: Diagnosis not present

## 2020-11-29 DIAGNOSIS — O3480 Maternal care for other abnormalities of pelvic organs, unspecified trimester: Secondary | ICD-10-CM

## 2020-11-29 DIAGNOSIS — N83209 Unspecified ovarian cyst, unspecified side: Secondary | ICD-10-CM

## 2020-11-29 IMAGING — US US MFM OB DETAIL+14 WK
1 series · 12 of 28 positions shown · non-contrast
Comparison: none

[REDACTED]
                                                            Ave[address] [HOSPITAL]

 2  US MFM UA CORD DOPPLER                76820.02    MOATLAREDI SAMARAJI
Indications
 29 weeks gestation of pregnancy
 Advanced maternal age multigravida 35+,
 third trimester
 Maternal care for known or suspected poor
 fetal growth, third trimester, fetus 1 IUGR
 Encounter for antenatal screening for
 malformations
 Poor obstetric history: Previous preterm
 delivery at 34 weeks
 Ovarian cyst (simple- LT)
Fetal Evaluation
 Num Of Fetuses:         1
 Fetal Heart Rate(bpm):  135
 Cardiac Activity:       Observed
 Presentation:           Cephalic
 Placenta:               Right lateral
 P. Cord Insertion:      Visualized, central
 Amniotic Fluid
 AFI FV:      Within normal limits
 AFI Sum(cm)     %Tile       Largest Pocket(cm)
 16.4            60
 RUQ(cm)       RLQ(cm)       LUQ(cm)        LLQ(cm)
Biometry
 BPD:      68.8  mm     G. Age:  27w 5d          7  %    CI:        71.12   %    70 - 86
                                                         FL/HC:      21.2   %    19.6 -
 HC:      259.9  mm     G. Age:  28w 2d          6  %    HC/AC:      1.16        0.99 -
 AC:      223.6  mm     G. Age:  26w 5d        2.4  %    FL/BPD:     80.2   %    71 - 87
 FL:       55.2  mm     G. Age:  29w 1d         38  %    FL/AC:      24.7   %    20 - 24
 HUM:      48.6  mm     G. Age:  28w 4d         34  %
 CER:      32.7  mm     G. Age:  27w 6d         28  %
 LV:        3.8  mm
 CM:        6.7  mm
 Est. FW:    9940  gm      2 lb 8 oz      8  %
OB History
 Gravidity:    3         Prem:   1         SAB:   1
 Living:       1
Gestational Age
 LMP:           29w 0d        Date:  05/10/20                 EDD:   02/14/21
 U/S Today:     28w 0d                                        EDD:   02/21/21
 Best:          29w 0d     Det. By:  LMP  (05/10/20)          EDD:   02/14/21
Anatomy
 Cranium:               Appears normal         Aortic Arch:            Not well visualized
 Cavum:                 Appears normal         Ductal Arch:            Not well visualized
 Ventricles:            Appears normal         Diaphragm:              Appears normal
 Choroid Plexus:        Appears normal         Stomach:                Appears normal, left
                                                                       sided
 Cerebellum:            Appears normal         Abdomen:                Appears normal
 Posterior Fossa:       Appears normal         Abdominal Wall:         Appears nml (cord
                                                                       insert, abd wall)
 Nuchal Fold:           Not applicable (>20    Cord Vessels:           Appears normal (3
                        wks GA)                                        vessel cord)
 Face:                  Appears normal         Kidneys:                Appear normal
                        (orbits and profile)
 Lips:                  Appears normal         Bladder:                Appears normal
 Thoracic:              Appears normal         Spine:                  Appears normal
 Heart:                 Appears normal         Upper Extremities:      Not well visualized
                        (4CH, axis, and
                        situs)
 RVOT:                  Appears normal         Lower Extremities:      Appears normal
 LVOT:                  Appears normal
 Other:  Fetus appears to be female. Heels and 5th digit not well visualized.
         VC, 3VV and 3VTV visualized.
Doppler - Fetal Vessels
 Umbilical Artery
  S/D     %tile      RI    %tile      PI    %tile     PSV    ADFV    RDFV
                                                    (cm/s)
  2.89       48    0.65       50    0.[REDACTED]      No      No
Cervix Uterus Adnexa
 Cervix
 Not visualized (advanced GA >06wks)
 Right Ovary
 Visualized.
 Left Ovary
 Simple cyst measuring 4.43 x 3.63 cm
Impression
 On today's ultrasound, the estimated fetal weight is at the 8
 percentile.  Abdominal circumference measurement is at the
 2nd percentile.  Amniotic fluid is normal and good fetal
 activity seen.  Fetal anatomical survey appears normal but
 limited by advanced gestational age.  Umbilical artery
 Doppler study showed normal forward diastolic flow.
 A small simple left ovarian cyst is seen (measurements
 above).

[Series 1: us mfm ob detail+14 wk · 76 acquisitions, 12 frames shown]
[im 3/76]
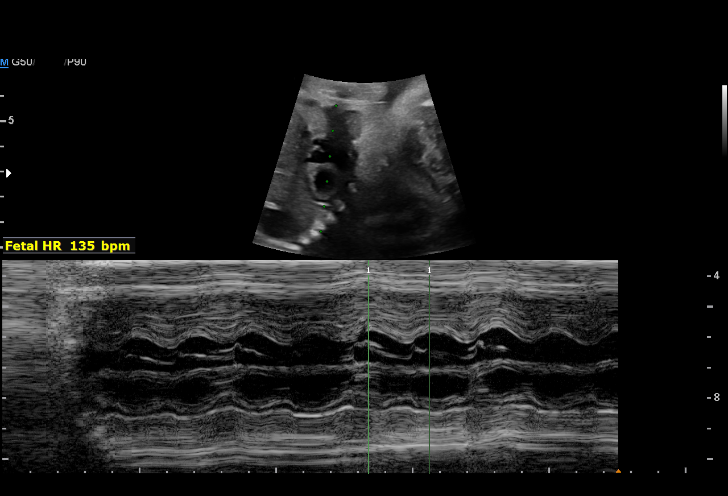
[im 9/76]
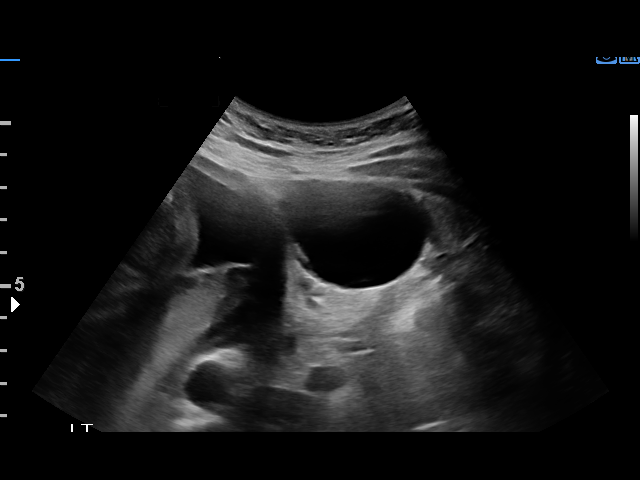
[im 14/76]
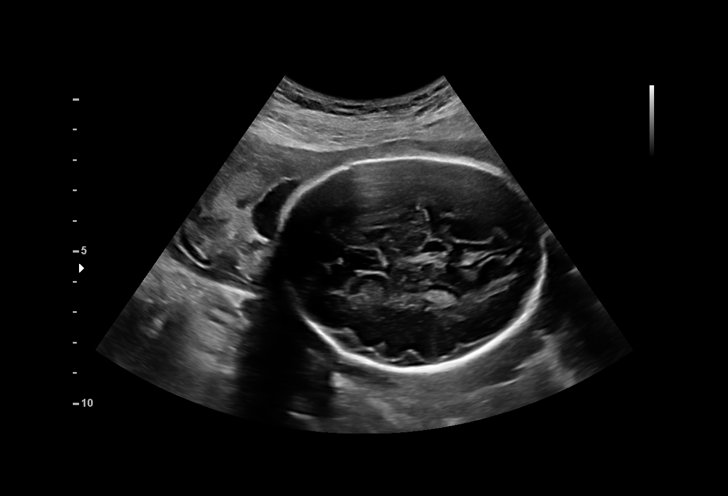
[im 23/76]
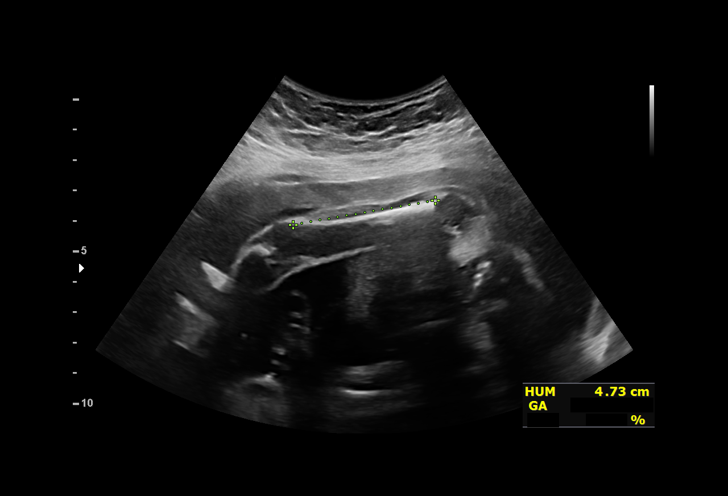
[im 28/76]
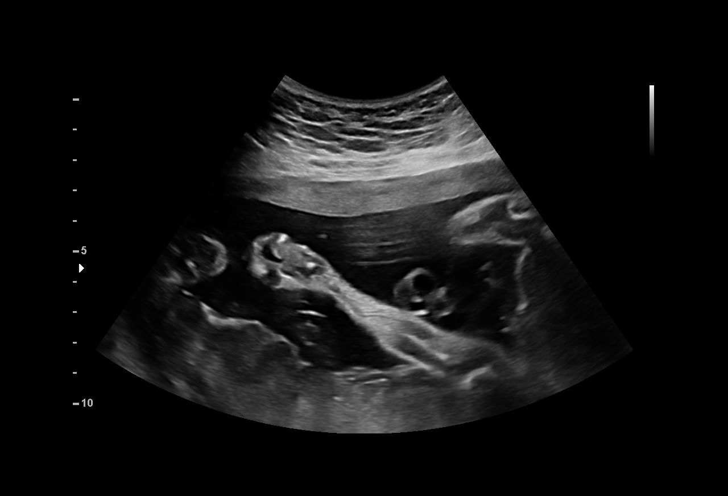
[im 34/76]
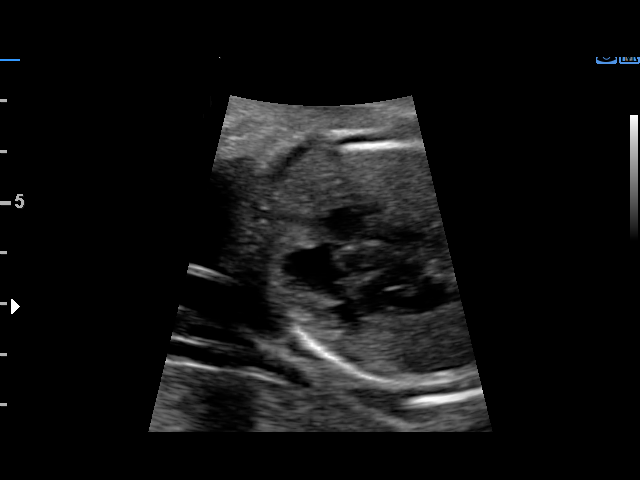
[im 42/76]
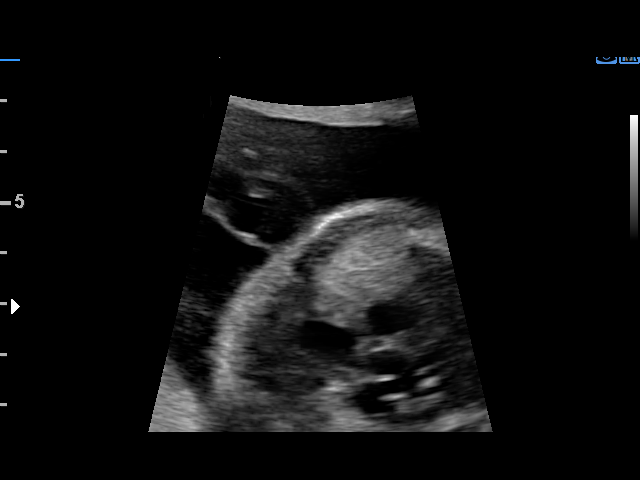
[im 48/76]
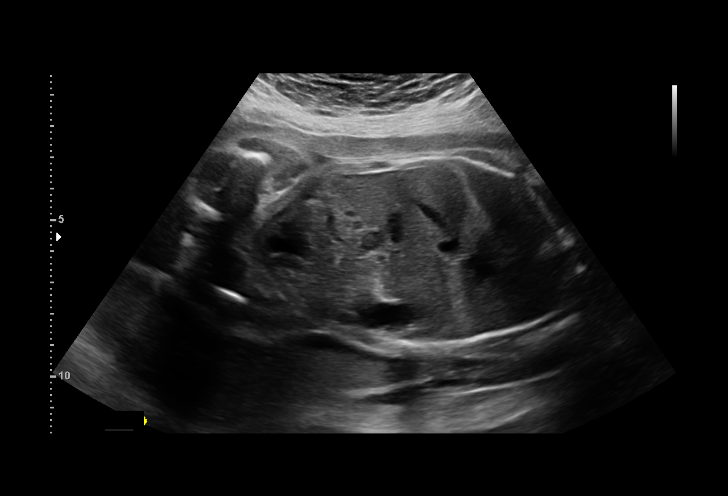
[im 53/76]
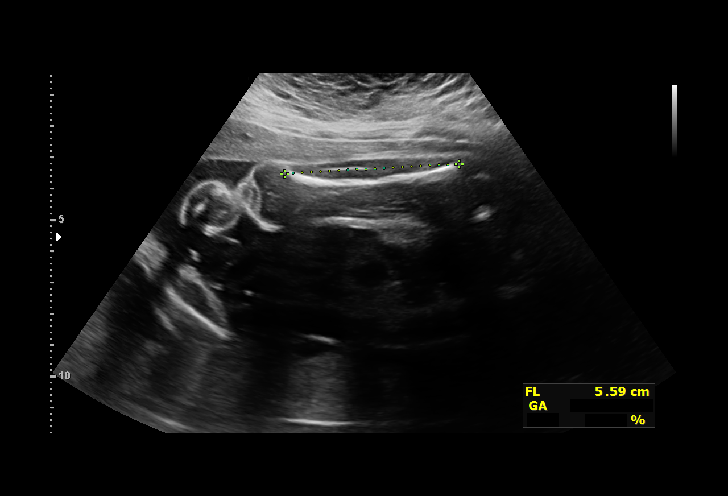
[im 62/76]
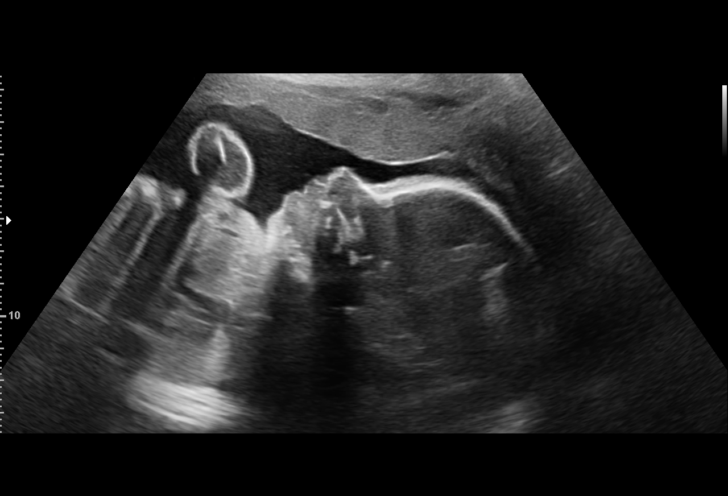
[im 67/76]
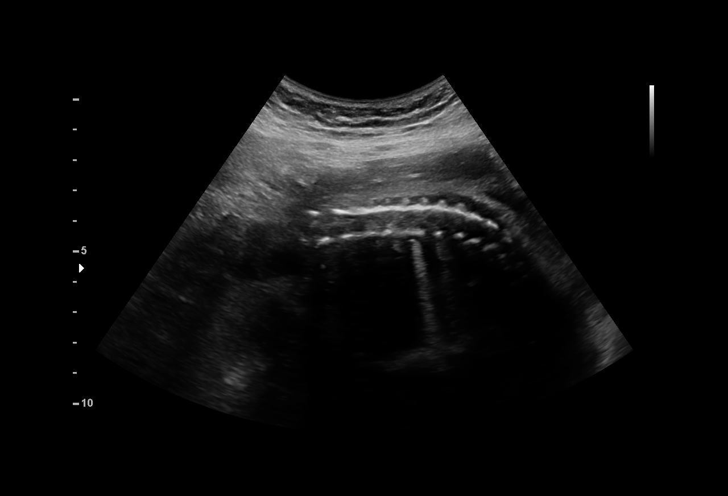
[im 73/76]
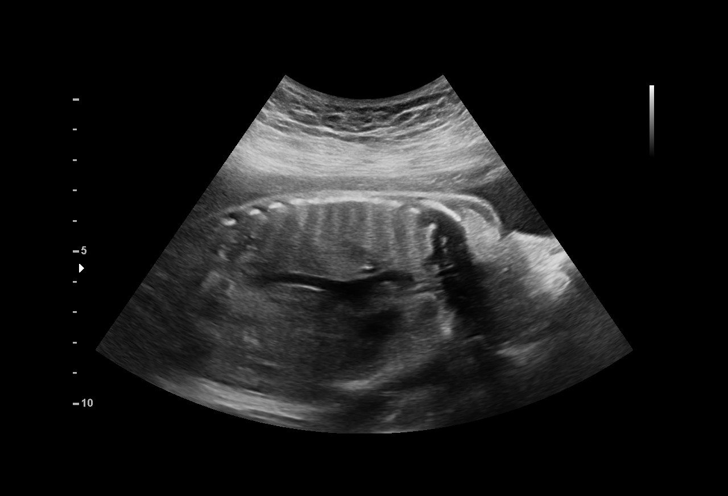

[12 of 28 positions shown; findings below may reference images not displayed]

IMPRESSION: Fetal growth restriction.
 [phone]
 Consultation ([REDACTED])

 [facility] the [facility]  She is G3 BKUUU at 29-weeks'
 gestation and here for ultrasound and consultation.  At your
 office ultrasound, fetal growth restriction was suspected.
 Her prenatal course was, otherwise, uneventful.  On cell free
 fetal DNA screening, she had low risk for fetal aneuploidies.
 On ultrasound performed at your office, the estimated fetal
 weight was at the 10th percentile.
 Past medical history: No history of diabetes or hypertension
 or any other chronic medical conditions.
 Past surgical history: Nil of note.
 Medications: Prenatal vitamins.
 Allergies: No known drug allergies.
 Social history: Denies tobacco or drug or alcohol use.  Her
 partner is African-American and he is in good health.
 Family history: No history of venous thromboembolism in the
 family.
 Obstetric history significant for a spontaneous preterm birth at
 33 weeks gestation of a male infant weighing 4 pounds and 8
 ounces at birth.  Her son is in good health.
 GYN history: No history of abnormal Pap smears or cervical
 surgeries.  No history of breast disease.  Her previous
 menstrual cycles were reportedly regular.
 Prenatal course: Advanced maternal age.  She does not have
 gestational diabetes.  Patient had opted not to take
 progesterone prophylactic injections (history of preterm
 delivery).
 Blood pressure today at her office is 125/70 mmHg.
 Fetal growth restriction
 -I explained ultrasound findings confirming fetal growth
 restriction that is difficult to differentiate from a constitutionally
 small fetus.
 -The most common cause of fetal growth restriction is
 placental insufficiency.  Other causes include fetal
 chromosomal anomalies or genetic conditions.  Infection is
 unlikely.
 -Although cell free fetal DNA screening is reassuring, only
 amniocentesis will give a definitive result on the fetal
 karyotype.  Patient opted not to have amniocentesis.
 -I explained close fetal surveillance in pregnancy is
 complicated by fetal growth restriction.
Recommendations

 -An appointment was made for her to return in 2 weeks for
 umbilical artery Doppler study.
 -Weekly BPP for and UA Doppler studies from 32 weeks
 gestation.
 -Timing of delivery will be based on follow-up fetal growth
 assessments.
                 Ling, [place]

## 2020-11-29 NOTE — Progress Notes (Signed)
Maternal-Fetal Medicine   Name: Tina Larsen DOB: 07/10/1985 MRN: [id number] Referring Provider: Steva Ready, MD  I had the pleasure of seeing [facility]. [facility].  She is G3 P0111 at 29-weeks' gestation and here for ultrasound and consultation.  At your office ultrasound, fetal growth restriction was suspected.  Her prenatal course was, otherwise, uneventful.  On cell free fetal DNA screening, she had low risk for fetal aneuploidies.  On ultrasound performed at your office, the estimated fetal weight was at the 10th percentile.  Past medical history: No history of diabetes or hypertension or any other chronic medical conditions. Past surgical history: Nil of note. Medications: Prenatal vitamins. Allergies: No known drug allergies. Social history: Denies tobacco or drug or alcohol use.  Her partner is African-American and he is in good health. Family history: No history of venous thromboembolism in the family. Obstetric history significant for a spontaneous preterm birth at [redacted] weeks gestation of a female infant weighing 4 pounds and 8 ounces at birth.  Her son is in good health. GYN history: No history of abnormal Pap smears or cervical surgeries.  No history of breast disease.  Her previous menstrual cycles were reportedly regular.  Prenatal course: Advanced maternal age.  She does not have gestational diabetes.  Patient had opted not to take progesterone prophylactic injections (history of preterm delivery).  Ultrasound On today's ultrasound, the estimated fetal weight is at the 8 percentile.  Abdominal circumference measurement is at the 2nd percentile.  Amniotic fluid is normal and good fetal activity seen.  Fetal anatomical survey appears normal but limited by advanced gestational age.  Umbilical artery Doppler study showed normal forward diastolic flow. A small simple left ovarian cyst is seen (measurements in ultrasound report).  Impression:  Fetal growth restriction.  Blood pressure today at her office is 125/70 mmHg. Fetal growth restriction -I explained ultrasound findings confirming fetal growth restriction that is difficult to differentiate from a constitutionally small fetus.  -The most common cause of fetal growth restriction is placental insufficiency.  Other causes include fetal chromosomal anomalies or genetic conditions.  Infection is unlikely.  -Although cell free fetal DNA screening is reassuring, only amniocentesis will give a definitive result on the fetal karyotype.  Patient opted not to have amniocentesis.  -I explained close fetal surveillance in pregnancy is complicated by fetal growth restriction.  Recommendations: -An appointment was made for her to return in 2 weeks for umbilical artery Doppler study. -Weekly BPP for and UA Doppler studies from [redacted] weeks gestation. -Timing of delivery will be based on follow-up fetal growth assessments.  Thank you for consultation.  If you have any questions or concerns, please contact me the [facility].  Consultation including face-to-face counseling 30 minutes.

## 2020-12-14 ENCOUNTER — Other Ambulatory Visit: Payer: Self-pay | Admitting: Obstetrics and Gynecology

## 2020-12-14 ENCOUNTER — Other Ambulatory Visit: Payer: Self-pay

## 2020-12-14 ENCOUNTER — Ambulatory Visit: Payer: BLUE CROSS/BLUE SHIELD | Attending: Obstetrics and Gynecology

## 2020-12-14 ENCOUNTER — Ambulatory Visit: Payer: BLUE CROSS/BLUE SHIELD | Admitting: *Deleted

## 2020-12-14 VITALS — BP 129/73 | HR 88

## 2020-12-14 DIAGNOSIS — O365931 Maternal care for other known or suspected poor fetal growth, third trimester, fetus 1: Secondary | ICD-10-CM

## 2020-12-14 DIAGNOSIS — O365930 Maternal care for other known or suspected poor fetal growth, third trimester, not applicable or unspecified: Secondary | ICD-10-CM | POA: Insufficient documentation

## 2020-12-14 DIAGNOSIS — O3480 Maternal care for other abnormalities of pelvic organs, unspecified trimester: Secondary | ICD-10-CM | POA: Diagnosis present

## 2020-12-14 DIAGNOSIS — O09523 Supervision of elderly multigravida, third trimester: Secondary | ICD-10-CM | POA: Diagnosis present

## 2020-12-14 DIAGNOSIS — O3483 Maternal care for other abnormalities of pelvic organs, third trimester: Secondary | ICD-10-CM

## 2020-12-14 DIAGNOSIS — O09213 Supervision of pregnancy with history of pre-term labor, third trimester: Secondary | ICD-10-CM | POA: Diagnosis not present

## 2020-12-14 DIAGNOSIS — N83209 Unspecified ovarian cyst, unspecified side: Secondary | ICD-10-CM

## 2020-12-14 DIAGNOSIS — Z363 Encounter for antenatal screening for malformations: Secondary | ICD-10-CM | POA: Diagnosis not present

## 2020-12-14 DIAGNOSIS — Z3A31 31 weeks gestation of pregnancy: Secondary | ICD-10-CM

## 2020-12-14 DIAGNOSIS — O09219 Supervision of pregnancy with history of pre-term labor, unspecified trimester: Secondary | ICD-10-CM

## 2020-12-14 DIAGNOSIS — N83202 Unspecified ovarian cyst, left side: Secondary | ICD-10-CM | POA: Diagnosis not present

## 2020-12-14 IMAGING — US US MFM UA CORD DOPPLER
1 series · 12 of 26 positions shown · non-contrast
Comparison: none

[Series 1: us mfm ua cord doppler · 26 acquisitions, 12 frames shown]
[im 2/26]
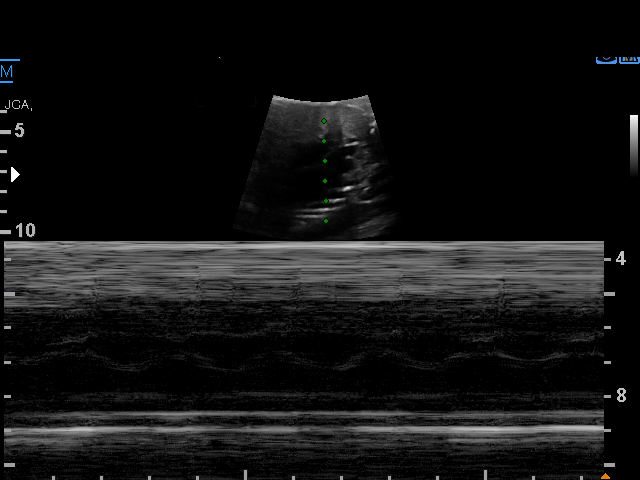
[im 4/26]
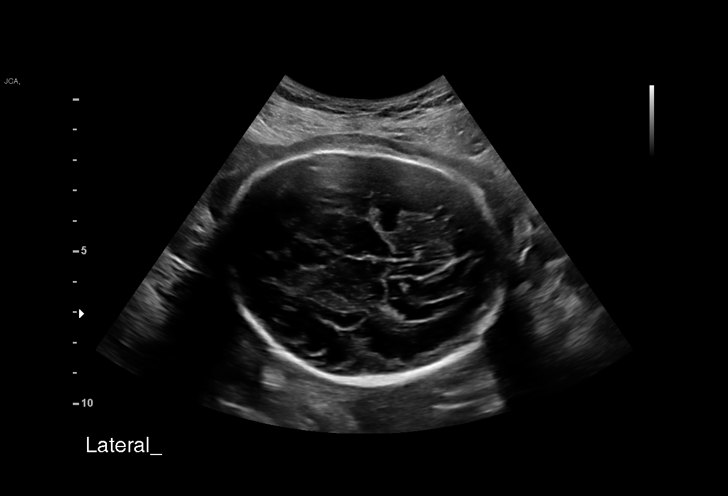
[im 6/26]
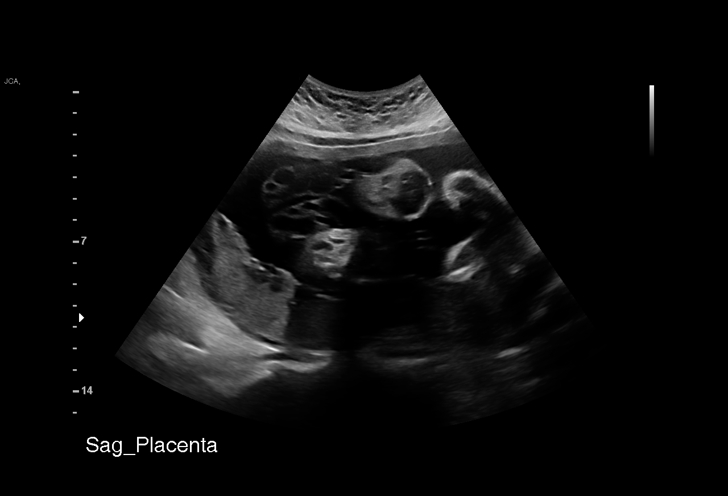
[im 8/26]
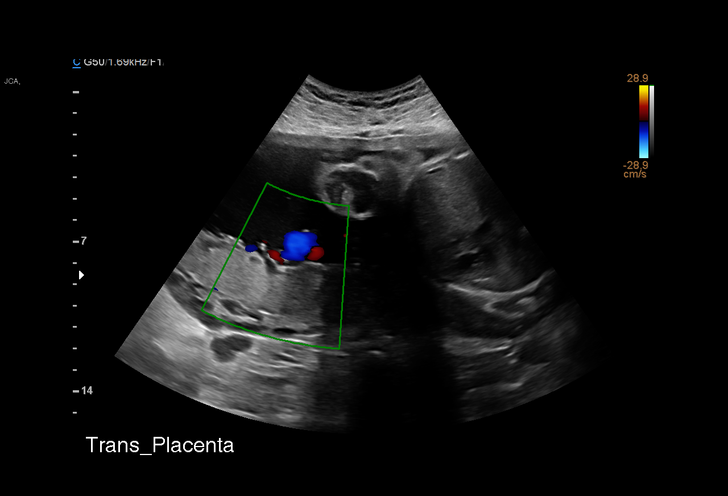
[im 10/26]
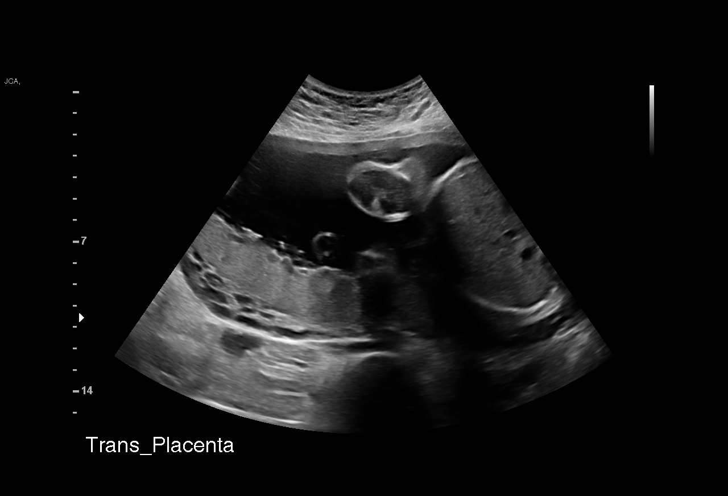
[im 12/26]
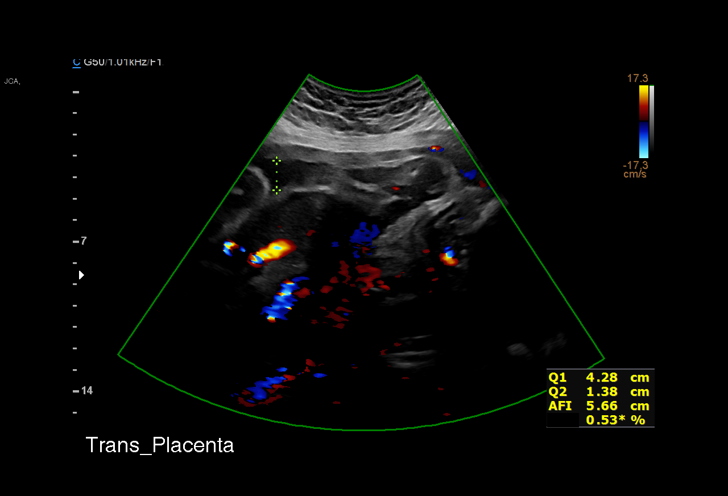
[im 15/26]
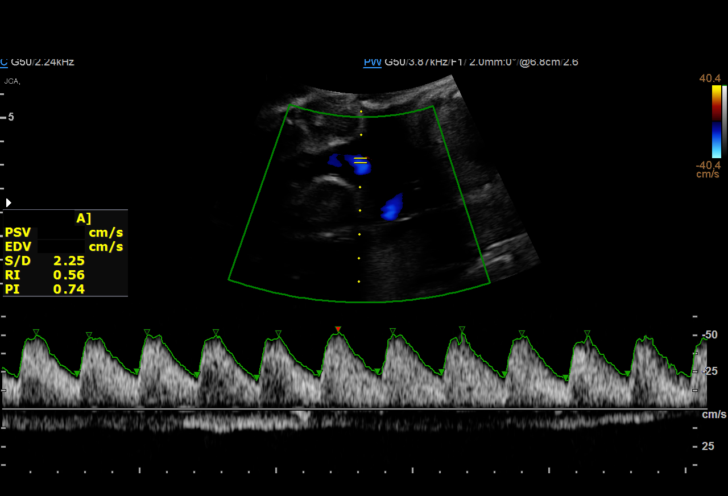
[im 17/26]
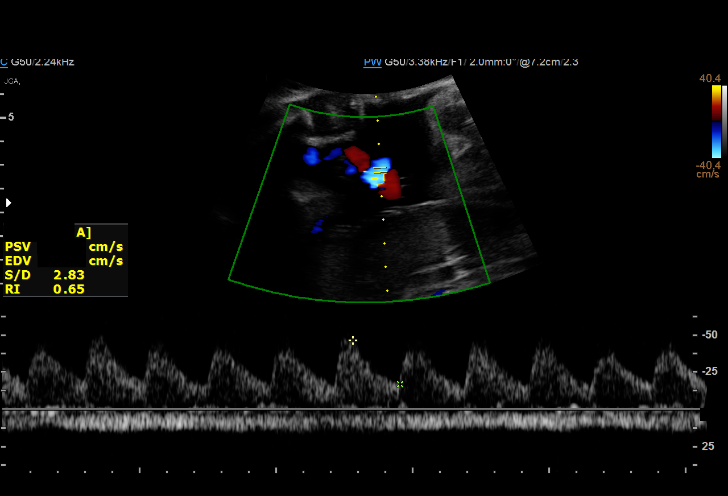
[im 19/26]
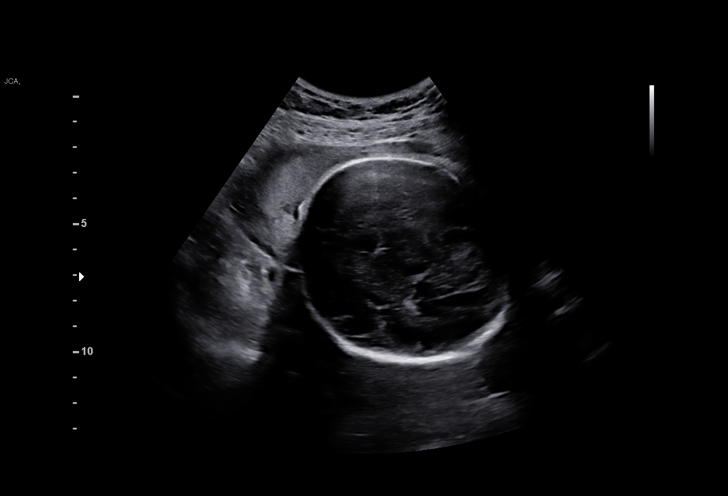
[im 21/26]
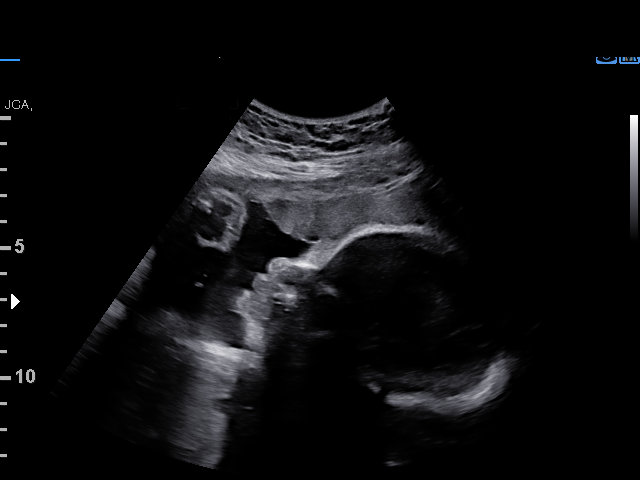
[im 23/26]
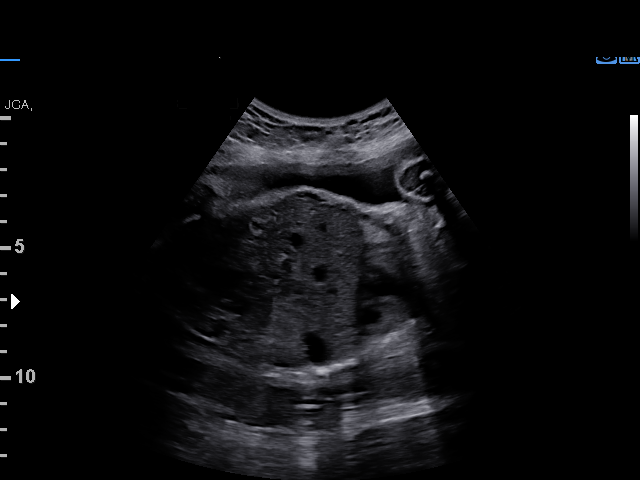
[im 25/26]
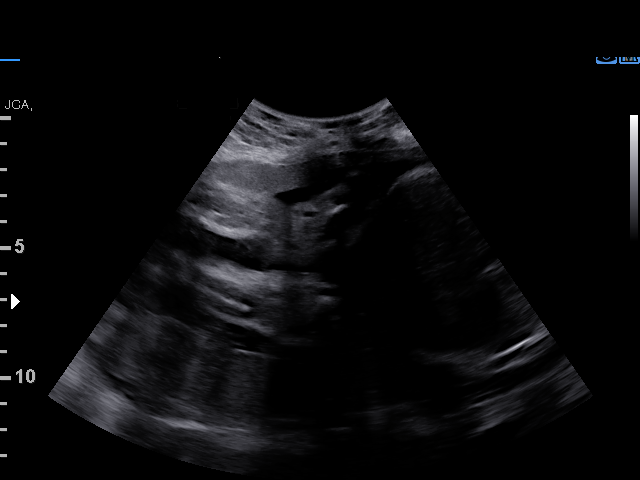

[12 of 26 positions shown; findings below may reference images not displayed]

Addendum:\.br----------------------------------------------------------------------
----------------------------------------------------------------------

----------------------------------------------------------------------

                                                            [REDACTED]
                                                            Ave., [HOSPITAL]
----------------------------------------------------------------------

----------------------------------------------------------------------

----------------------------------------------------------------------
Indications

 Maternal care for known or suspected poor
 fetal growth, third trimester, fetus 1 IUGR
 Advanced maternal age multigravida 35+,
 third trimester
 Poor obstetric history: Previous preterm
 delivery at 34 weeks
 Ovarian cyst (simple- LT)
 Encounter for antenatal screening for
 malformations
 31 weeks gestation of pregnancy
----------------------------------------------------------------------
Fetal Evaluation

 Num Of Fetuses:         1
 Fetal Heart Rate(bpm):  153
 Cardiac Activity:       Observed
 Presentation:           Cephalic
 Placenta:               Right lateral
 P. Cord Insertion:      Previously Visualized

 Amniotic Fluid
 AFI FV:      Within normal limits

 AFI Sum(cm)     %Tile       Largest Pocket(cm)
 16.16           58
 RUQ(cm)       RLQ(cm)       LUQ(cm)        LLQ(cm)

----------------------------------------------------------------------
Biometry

 LV:        4.3  mm
----------------------------------------------------------------------
OB History

 Gravidity:    3         Prem:   1         SAB:   1
 Living:       1
----------------------------------------------------------------------
Gestational Age

 LMP:           31w 1d        Date:  05/10/20                 EDD:   02/14/21
 Best:          31w 1d     Det. By:  LMP  (05/10/20)          EDD:   02/14/21
----------------------------------------------------------------------
Anatomy

 Cranium:               Appears normal         Diaphragm:              Appears normal
 Ventricles:            Appears normal         Stomach:                Appears normal, left
                                                                       sided
 Thoracic:              Appears normal         Bladder:                Appears normal
----------------------------------------------------------------------
Doppler - Fetal Vessels

 Umbilical Artery
  S/D     %tile      RI    %tile      PI    %tile            ADFV    RDFV
  2.57       39    0.61       41    0.88       40               No      No

----------------------------------------------------------------------
Comments

 This patient was seen due to an IUGR fetus.  She denies any
 problems since her last exam.  She reports feeling vigorous
 fetal movements throughout the day.
 Fetal body movements and fetal breathing movements were
 noted throughout today's exam.
 There was normal amniotic fluid noted.
 Doppler studies of the umbilical arteries performed due to
 fetal growth restriction showed a normal S/D ratio of 2.57.
 There were no signs of absent or reversed end-diastolic flow
 noted today.
 Due to fetal growth restriction, another biophysical profile and
 umbilical artery Doppler study was scheduled in 1 week.
----------------------------------------------------------------------
----------------------------------------------------------------------

*** End of Addendum ***\.br----------------------------------------------------------------------
----------------------------------------------------------------------

----------------------------------------------------------------------

                                                            [REDACTED]
                                                            Ave., [HOSPITAL]
----------------------------------------------------------------------

----------------------------------------------------------------------

----------------------------------------------------------------------
Indications

 Maternal care for known or suspected poor
 fetal growth, third trimester, fetus 1 IUGR
 Advanced maternal age multigravida 35+,
 third trimester
 Poor obstetric history: Previous preterm
 delivery at 34 weeks
 Ovarian cyst (simple- LT)
 Encounter for antenatal screening for
 malformations
 31 weeks gestation of pregnancy
----------------------------------------------------------------------
Fetal Evaluation

 Num Of Fetuses:         1
 Fetal Heart Rate(bpm):  153
 Cardiac Activity:       Observed
 Presentation:           Cephalic
 Placenta:               Right lateral
 P. Cord Insertion:      Previously Visualized

 Amniotic Fluid
 AFI FV:      Within normal limits

 AFI Sum(cm)     %Tile       Largest Pocket(cm)
 16.16           58

 RUQ(cm)       RLQ(cm)       LUQ(cm)        LLQ(cm)

----------------------------------------------------------------------
Biometry

 LV:        4.3  mm
----------------------------------------------------------------------
OB History

 Gravidity:    3         Prem:   1         SAB:   1
 Living:       1
----------------------------------------------------------------------
Gestational Age

 LMP:           31w 1d        Date:  05/10/20                 EDD:   02/14/21
 Best:          31w 1d     Det. By:  LMP  (05/10/20)          EDD:   02/14/21
----------------------------------------------------------------------
Anatomy

 Cranium:               Appears normal         Diaphragm:              Appears normal
 Ventricles:            Appears normal         Stomach:                Appears normal, left
                                                                       sided
 Thoracic:              Appears normal         Bladder:                Appears normal
----------------------------------------------------------------------
Doppler - Fetal Vessels

 Umbilical Artery
  S/D     %tile      RI    %tile      PI    %tile            ADFV    RDFV
  2.57       39    0.61       41    0.88       40               No      No

----------------------------------------------------------------------
Comments

 This patient was seen due to an IUGR fetus.  She denies any
 problems since her last exam.  She reports feeling vigorous
 fetal movements throughout the day.
 Fetal body movements and fetal breathing movements were
 noted throughout today's exam.
 There was normal amniotic fluid noted.
 Doppler studies of the umbilical arteries performed due to
 fetal growth restriction showed a normal S/D ratio of 2.57.
 There were no signs of absent or reversed end-diastolic flow
 noted today.
 Due to fetal growth restriction, another biophysical profile and
 umbilical artery Doppler study was scheduled in 1 week.
----------------------------------------------------------------------
----------------------------------------------------------------------

## 2020-12-15 ENCOUNTER — Other Ambulatory Visit: Payer: Self-pay | Admitting: *Deleted

## 2020-12-15 DIAGNOSIS — O365930 Maternal care for other known or suspected poor fetal growth, third trimester, not applicable or unspecified: Secondary | ICD-10-CM

## 2020-12-20 ENCOUNTER — Encounter: Payer: Self-pay | Admitting: *Deleted

## 2020-12-20 ENCOUNTER — Other Ambulatory Visit: Payer: Self-pay

## 2020-12-20 ENCOUNTER — Ambulatory Visit: Payer: BLUE CROSS/BLUE SHIELD | Admitting: *Deleted

## 2020-12-20 ENCOUNTER — Ambulatory Visit: Payer: BLUE CROSS/BLUE SHIELD | Attending: Obstetrics and Gynecology

## 2020-12-20 VITALS — BP 128/79 | HR 92

## 2020-12-20 DIAGNOSIS — O3483 Maternal care for other abnormalities of pelvic organs, third trimester: Secondary | ICD-10-CM | POA: Diagnosis not present

## 2020-12-20 DIAGNOSIS — O09219 Supervision of pregnancy with history of pre-term labor, unspecified trimester: Secondary | ICD-10-CM

## 2020-12-20 DIAGNOSIS — O365930 Maternal care for other known or suspected poor fetal growth, third trimester, not applicable or unspecified: Secondary | ICD-10-CM

## 2020-12-20 DIAGNOSIS — Z362 Encounter for other antenatal screening follow-up: Secondary | ICD-10-CM

## 2020-12-20 DIAGNOSIS — O3480 Maternal care for other abnormalities of pelvic organs, unspecified trimester: Secondary | ICD-10-CM | POA: Insufficient documentation

## 2020-12-20 DIAGNOSIS — O09523 Supervision of elderly multigravida, third trimester: Secondary | ICD-10-CM | POA: Diagnosis not present

## 2020-12-20 DIAGNOSIS — N83209 Unspecified ovarian cyst, unspecified side: Secondary | ICD-10-CM | POA: Insufficient documentation

## 2020-12-20 DIAGNOSIS — Z3A32 32 weeks gestation of pregnancy: Secondary | ICD-10-CM

## 2020-12-20 DIAGNOSIS — N83202 Unspecified ovarian cyst, left side: Secondary | ICD-10-CM

## 2020-12-20 DIAGNOSIS — O365931 Maternal care for other known or suspected poor fetal growth, third trimester, fetus 1: Secondary | ICD-10-CM | POA: Diagnosis present

## 2020-12-20 DIAGNOSIS — O09213 Supervision of pregnancy with history of pre-term labor, third trimester: Secondary | ICD-10-CM

## 2020-12-20 IMAGING — US US MFM OB FOLLOW-UP
1 series · 13 of 28 positions shown · non-contrast
Comparison: none

[Series 1: us mfm ob follow-up · 13 of 56 slices shown]
[im 3/56]
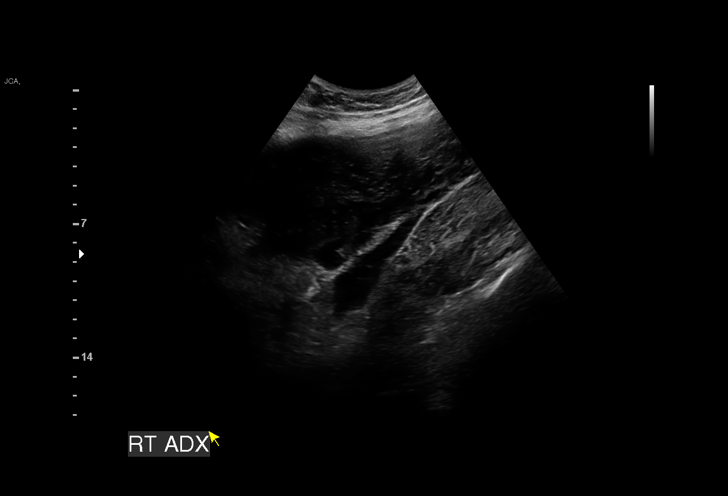
[im 7/56]
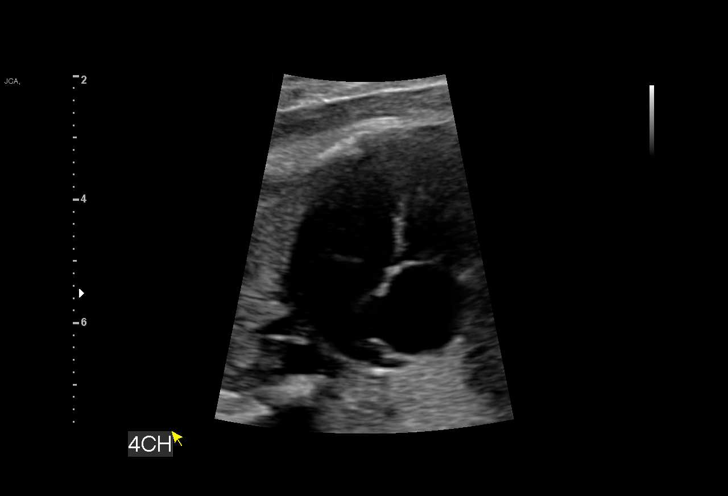
[im 11/56]
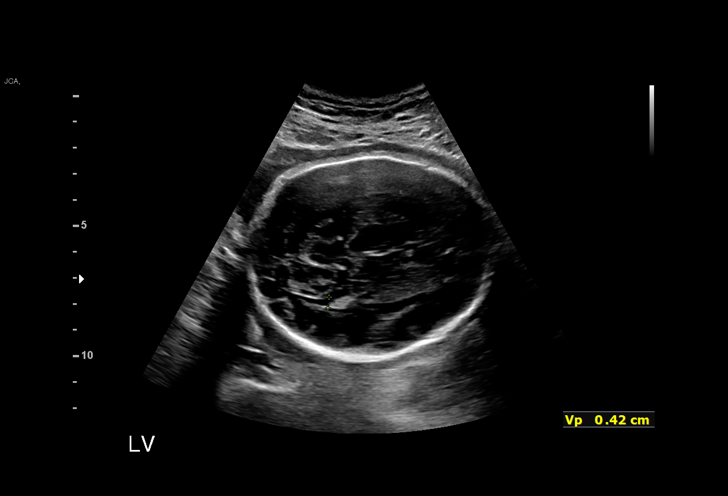
[im 15/56]
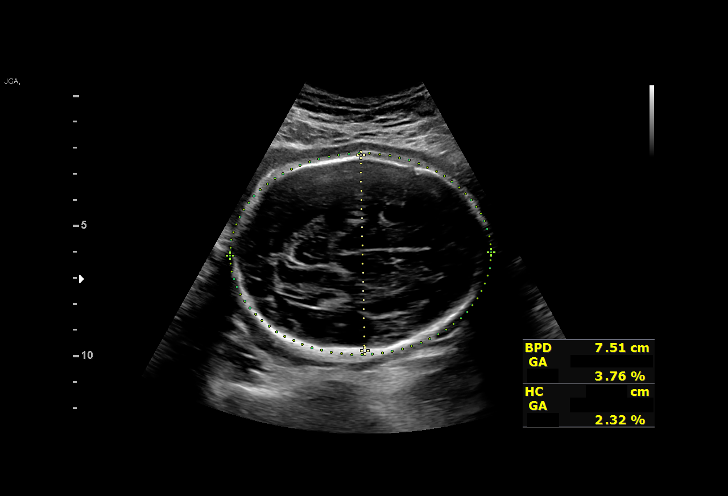
[im 19/56]
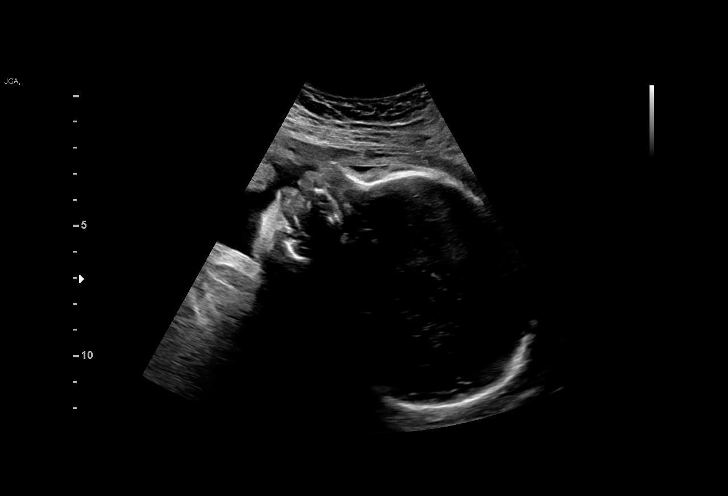
[im 23/56]
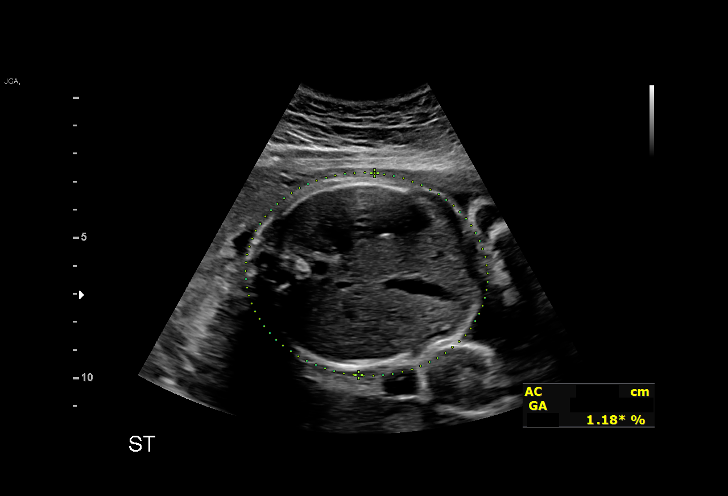
[im 29/56]
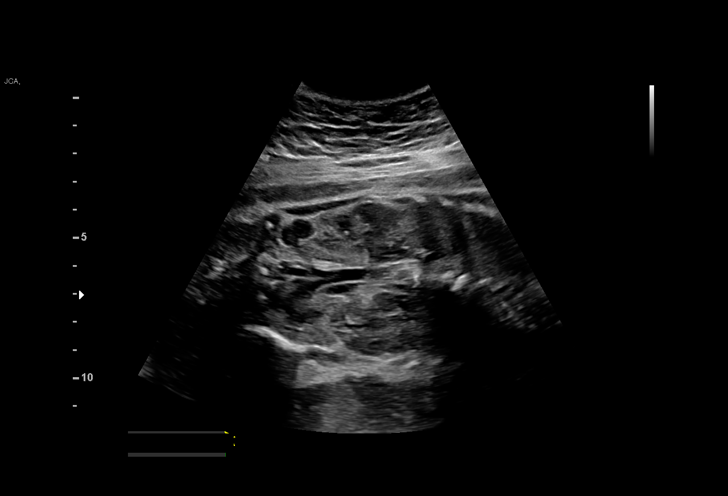
[im 33/56]
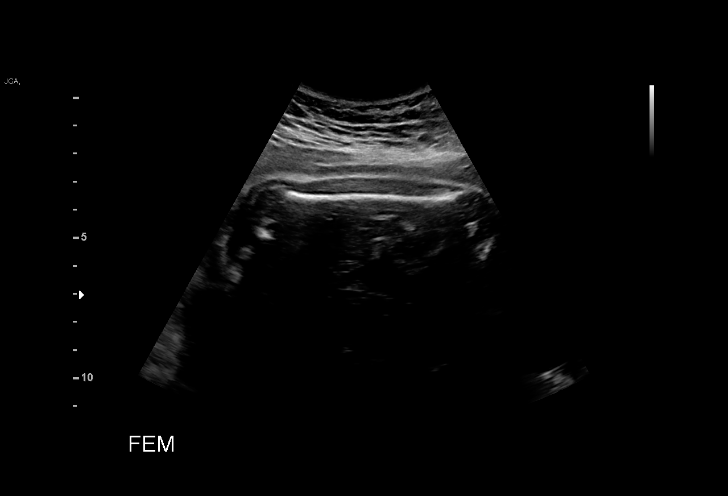
[im 37/56]
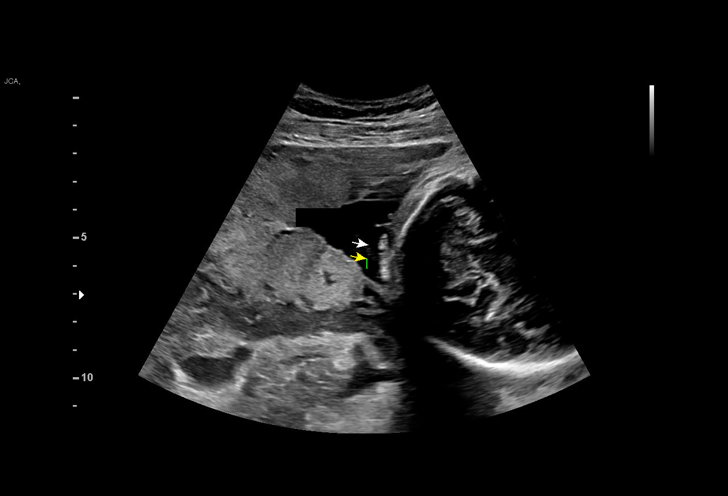
[im 41/56]
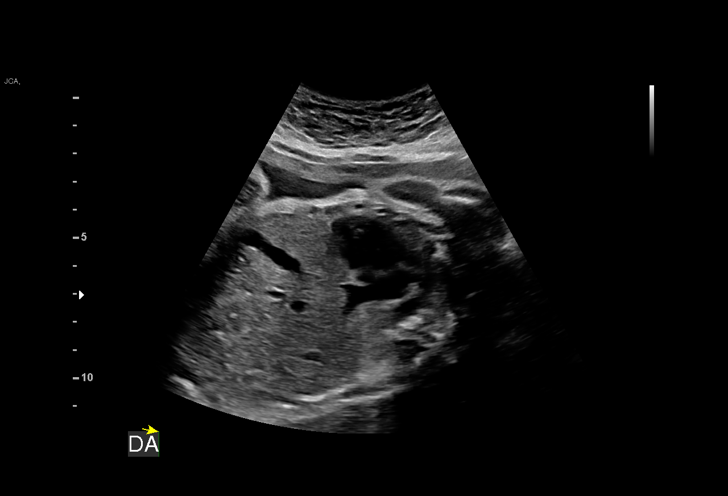
[im 45/56]
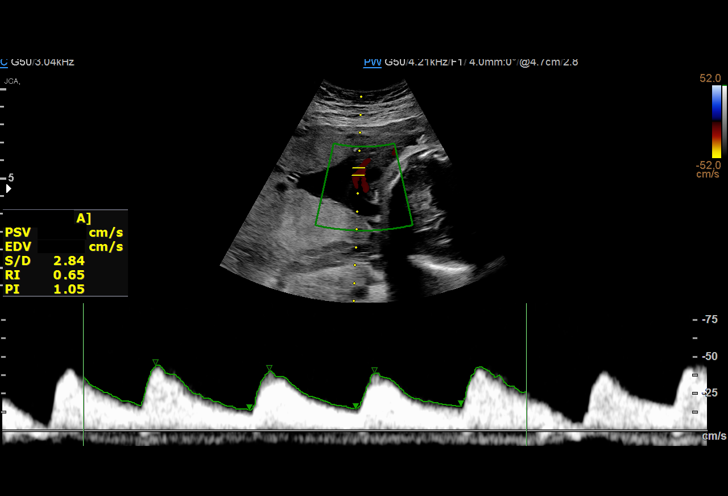
[im 49/56]
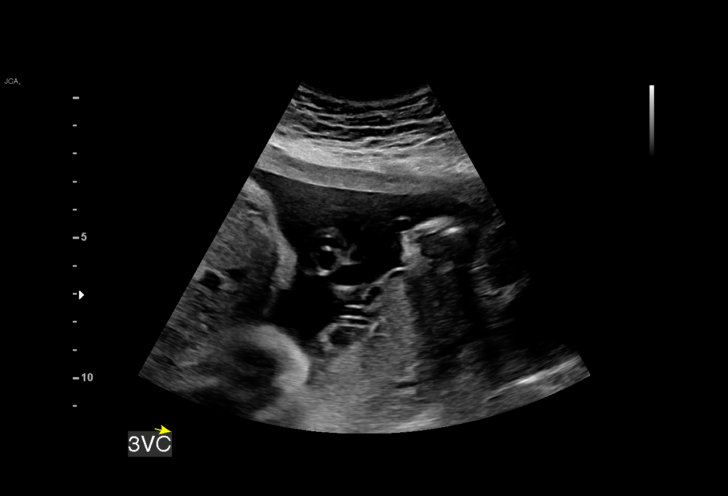
[im 53/56]
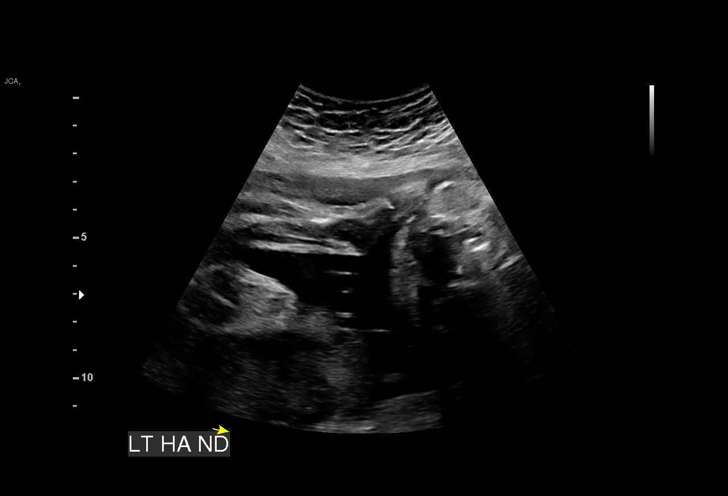

[13 of 28 positions shown; findings below may reference images not displayed]

[REDACTED]
                                                            Ave., [HOSPITAL]

Indications

 32 weeks gestation of pregnancy
 Maternal care for known or suspected poor
 fetal growth, third trimester, fetus 1 IUGR
 Advanced maternal age multigravida 35+,
 third trimester
 Poor obstetric history: Previous preterm
 delivery at 34 weeks
 Ovarian cyst (simple- LT)
 Encounter for other antenatal screening
 follow-up
Fetal Evaluation

 Num Of Fetuses:         1
 Preg. Location:         Intrauterine
 Fetal Heart Rate(bpm):  131
 Cardiac Activity:       Observed
 Presentation:           Cephalic
 Placenta:               Posterior
 Amniotic Fluid
 AFI FV:      Within normal limits

 AFI Sum(cm)     %Tile       Largest Pocket(cm)
 14.2            48

 RUQ(cm)       RLQ(cm)       LUQ(cm)        LLQ(cm)

Biophysical Evaluation

 Amniotic F.V:   Within normal limits       F. Tone:        Observed
 F. Movement:    Observed                   Score:          [DATE]
 F. Breathing:   Observed
Biometry

 BPD:      74.6  mm     G. Age:  29w 6d        2.6  %    CI:        70.78   %    70 - 86
                                                         FL/HC:      21.9   %    19.1 -
 HC:      282.6  mm     G. Age:  31w 0d        3.3  %    HC/AC:      1.15        0.96 -
 AC:      244.9  mm     G. Age:  28w 5d        < 1  %    FL/BPD:     82.8   %    71 - 87
 FL:       61.8  mm     G. Age:  32w 0d         38  %    FL/AC:      25.2   %    20 - 24
 HUM:      54.7  mm     G. Age:  31w 6d         47  %

 LV:        4.2  mm

 Est. FW:    7479  gm      3 lb 6 oz      4  %
OB History

 Gravidity:    3         Prem:   1         SAB:   1
 Living:       1
Gestational Age

 LMP:           32w 0d        Date:  05/10/20                 EDD:   02/14/21
 U/S Today:     30w 3d                                        EDD:   02/25/21
 Best:          32w 0d     Det. By:  LMP  (05/10/20)          EDD:   02/14/21
Anatomy

 Cranium:               Appears normal         Aortic Arch:            Not well visualized
 Cavum:                 Appears normal         Ductal Arch:            Appears normal
 Ventricles:            Appears normal         Diaphragm:              Appears normal
 Choroid Plexus:        Previously seen        Stomach:                Appears normal, left
                                                                       sided
 Cerebellum:            Previously seen        Abdomen:                Previously seen
 Posterior Fossa:       Previously seen        Abdominal Wall:         Previously seen
 Nuchal Fold:           Not applicable (>20    Cord Vessels:           Appears normal (3
                        wks GA)                                        vessel cord)
 Face:                  Profile appears        Kidneys:                Appear normal
                        normal
 Lips:                  Previously seen        Bladder:                Appears normal
 Thoracic:              Previously seen        Spine:                  Previously seen
 Heart:                 Limited Views          Upper Extremities:      Visualized
 RVOT:                  Previously seen        Lower Extremities:      Visualized
 LVOT:                  Previously seen

 Other:  Fetus appears to be female. Heels and 5th digit not well visualized.
         VC, 3VV and 3VTV previously visualized.
Doppler - Fetal Vessels

 Umbilical Artery
  S/D     %tile      RI    %tile      PI    %tile            ADFV    RDFV
  2.76       55    0.64       63    0.98       65               No      No

Cervix Uterus Adnexa

 Cervix
 Not visualized (advanced GA >36wks)

 Uterus
 No abnormality visualized.

 Right Ovary
 Not visualized.

 Left Ovary
 Not visualized.

 Cul De Sac
 No free fluid seen.

 Adnexa
 No abnormality visualized.
Impression

 Fetal growth restriction.  Patient return for fetal growth
 assessment and antenatal testing.
 On ultrasound, the estimated fetal weight is at the 4th
 percentile.  Interval weight gain is 391 g over 3 weeks.
 Abdominal circumference is at the 1st percentile.  Head
 circumference measurement is at -1 SD (normal).  Amniotic
 fluid is normal good fetal activity seen.
 Umbilical artery Doppler showed normal forward diastolic
 flow.  BPP [DATE].

 I reassured the patient of the findings.  Blood pressure today
 at her office is 128/79 mmHg.
Recommendations

 -Continue weekly BPP and UA Doppler till delivery.
 -We make recommendations on the timing of delivery after
 next fetal growth assessment.
                 Hessler, Ronda

## 2020-12-27 ENCOUNTER — Ambulatory Visit: Payer: Commercial Managed Care - PPO | Attending: Obstetrics and Gynecology

## 2020-12-27 ENCOUNTER — Ambulatory Visit (HOSPITAL_BASED_OUTPATIENT_CLINIC_OR_DEPARTMENT_OTHER): Payer: Commercial Managed Care - PPO | Admitting: Maternal & Fetal Medicine

## 2020-12-27 ENCOUNTER — Encounter: Payer: Self-pay | Admitting: *Deleted

## 2020-12-27 ENCOUNTER — Ambulatory Visit: Payer: Commercial Managed Care - PPO | Admitting: *Deleted

## 2020-12-27 ENCOUNTER — Other Ambulatory Visit: Payer: Self-pay

## 2020-12-27 VITALS — BP 118/75 | HR 88

## 2020-12-27 DIAGNOSIS — N83209 Unspecified ovarian cyst, unspecified side: Secondary | ICD-10-CM

## 2020-12-27 DIAGNOSIS — O365930 Maternal care for other known or suspected poor fetal growth, third trimester, not applicable or unspecified: Secondary | ICD-10-CM

## 2020-12-27 DIAGNOSIS — Z3A33 33 weeks gestation of pregnancy: Secondary | ICD-10-CM

## 2020-12-27 DIAGNOSIS — N83202 Unspecified ovarian cyst, left side: Secondary | ICD-10-CM

## 2020-12-27 DIAGNOSIS — O09219 Supervision of pregnancy with history of pre-term labor, unspecified trimester: Secondary | ICD-10-CM | POA: Insufficient documentation

## 2020-12-27 DIAGNOSIS — O09213 Supervision of pregnancy with history of pre-term labor, third trimester: Secondary | ICD-10-CM

## 2020-12-27 DIAGNOSIS — O3483 Maternal care for other abnormalities of pelvic organs, third trimester: Secondary | ICD-10-CM

## 2020-12-27 DIAGNOSIS — O3480 Maternal care for other abnormalities of pelvic organs, unspecified trimester: Secondary | ICD-10-CM | POA: Insufficient documentation

## 2020-12-27 DIAGNOSIS — O365931 Maternal care for other known or suspected poor fetal growth, third trimester, fetus 1: Secondary | ICD-10-CM | POA: Diagnosis present

## 2020-12-27 DIAGNOSIS — Z364 Encounter for antenatal screening for fetal growth retardation: Secondary | ICD-10-CM | POA: Diagnosis present

## 2020-12-27 DIAGNOSIS — O09523 Supervision of elderly multigravida, third trimester: Secondary | ICD-10-CM | POA: Diagnosis not present

## 2020-12-27 IMAGING — US US MFM FETAL BPP W/O NON-STRESS
1 series · 14 of 28 positions shown · non-contrast
Comparison: none

[Series 1: us mfm fetal bpp w/o non-stress · 31 acquisitions, 14 frames shown]
[im 2/31]
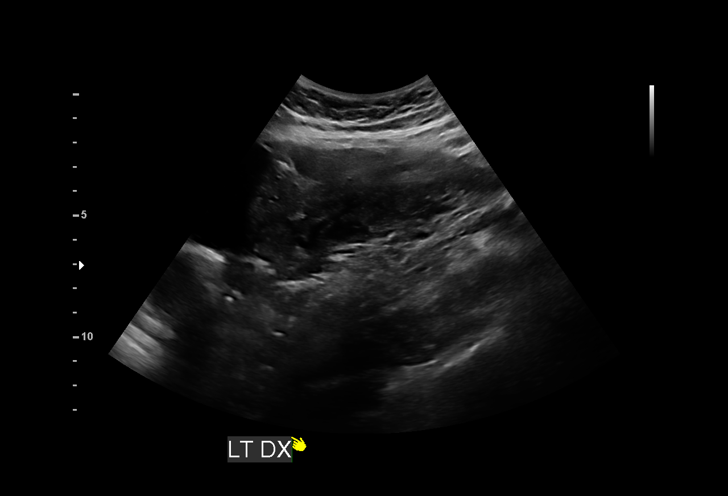
[im 4/31]
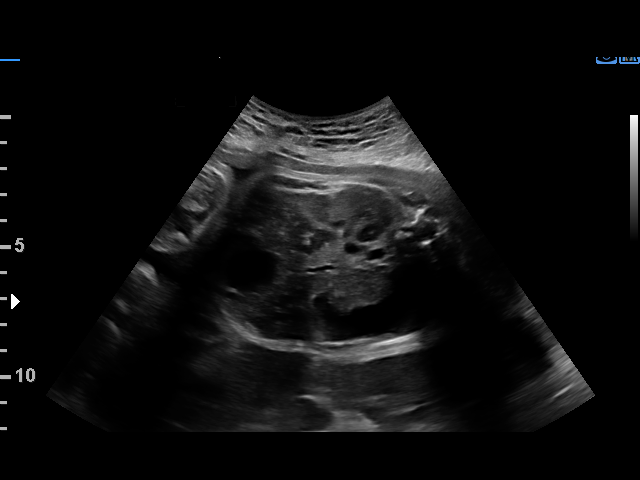
[im 6/31]
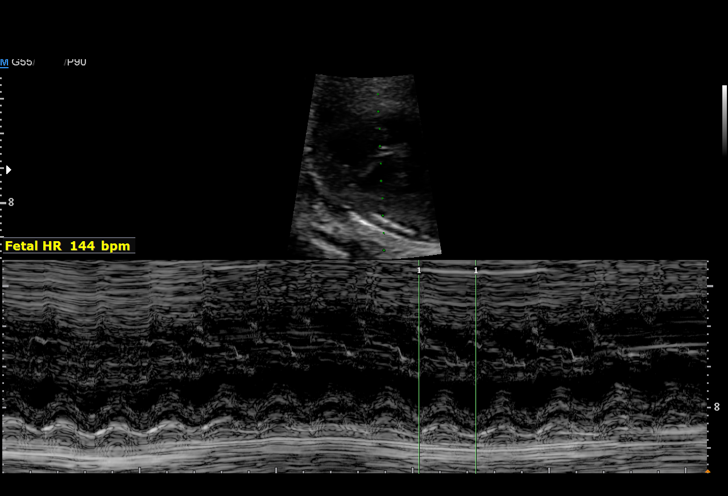
[im 8/31]
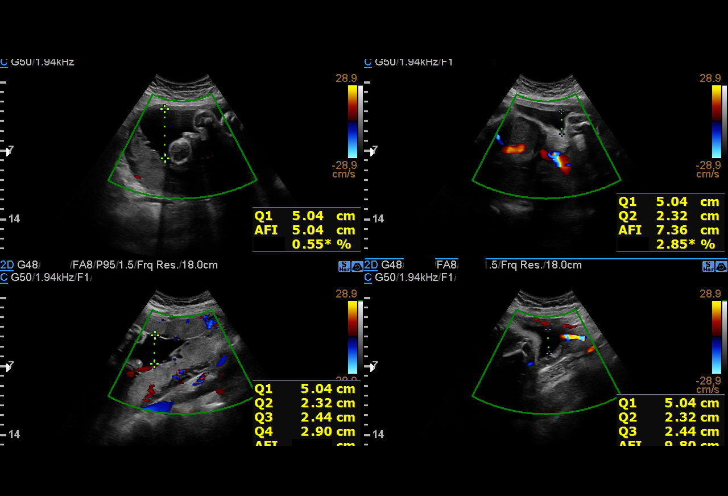
[im 11/31]
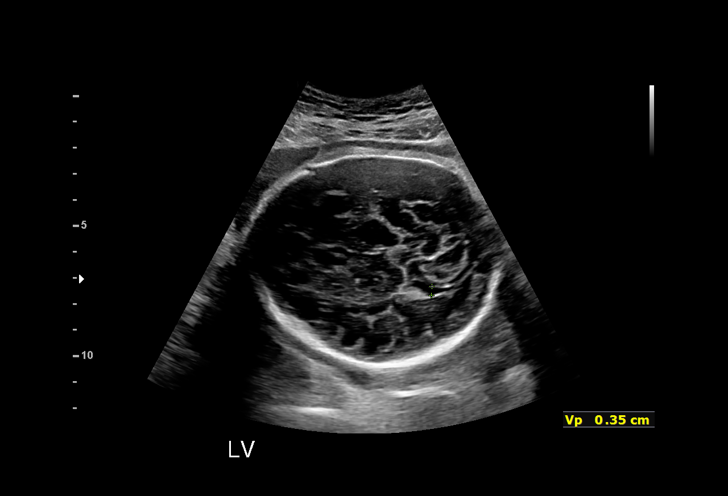
[im 13/31]
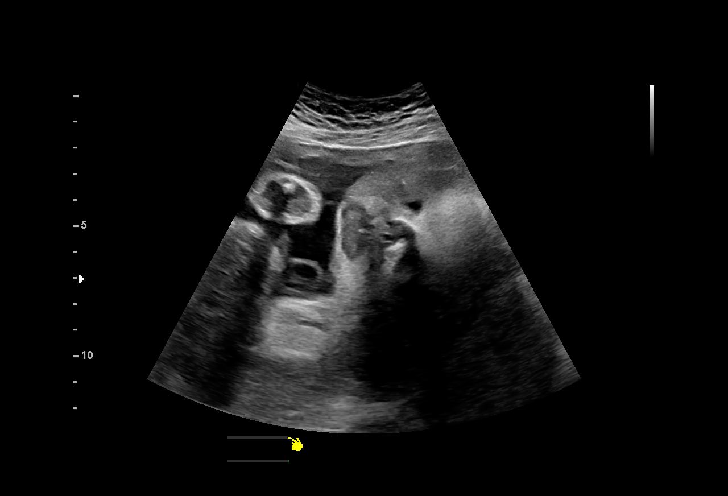
[im 15/31]
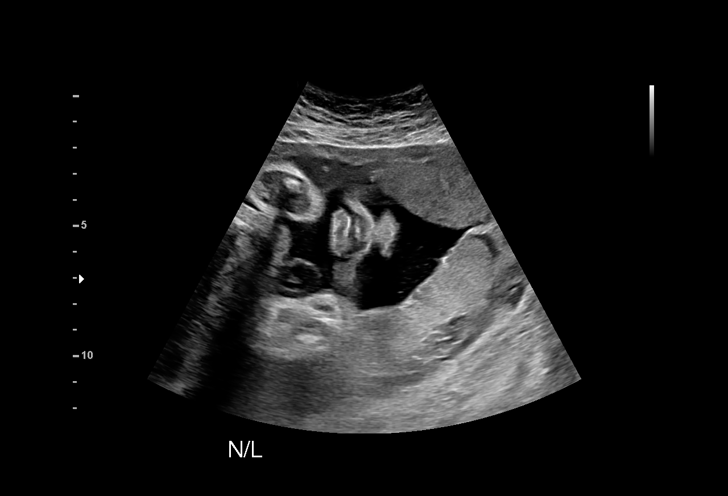
[im 17/31]
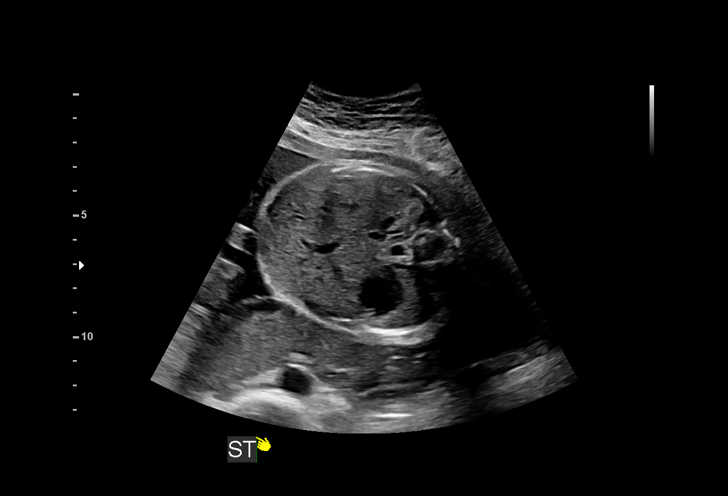
[im 19/31]
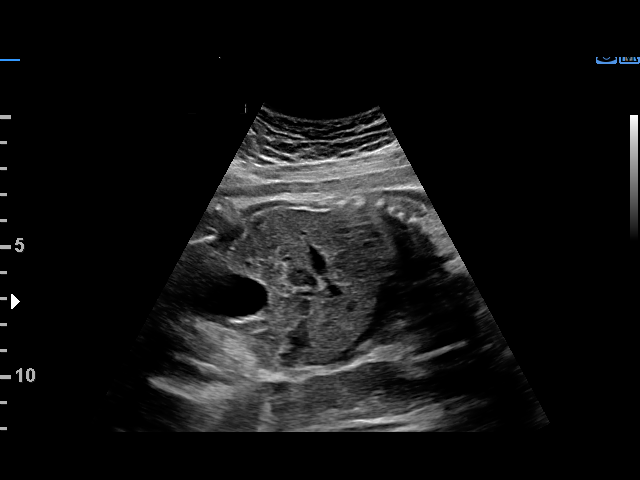
[im 22/31]
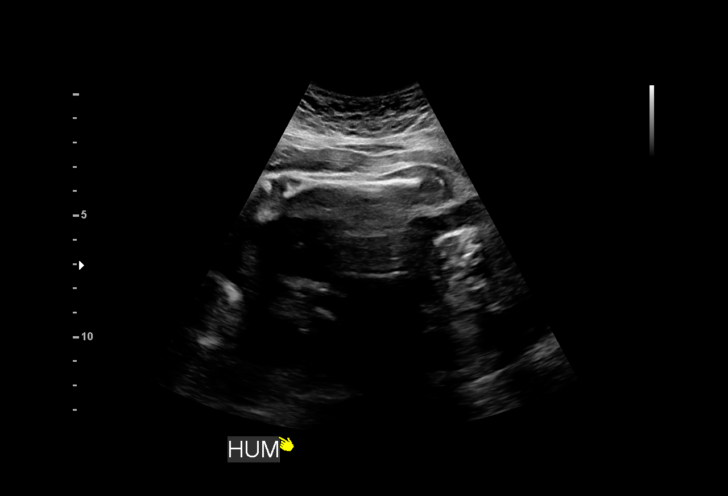
[im 24/31]
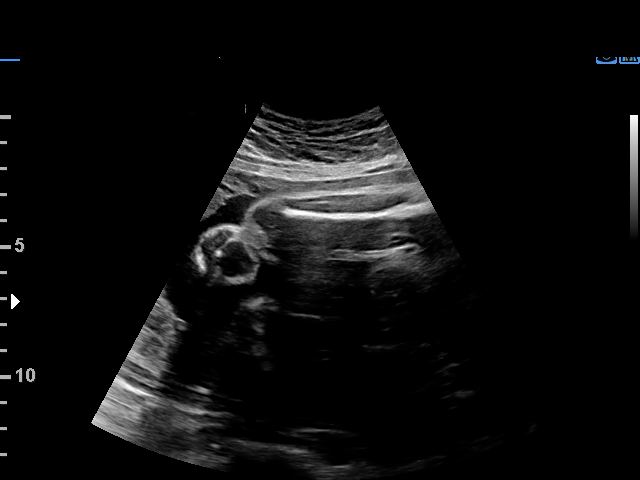
[im 26/31]
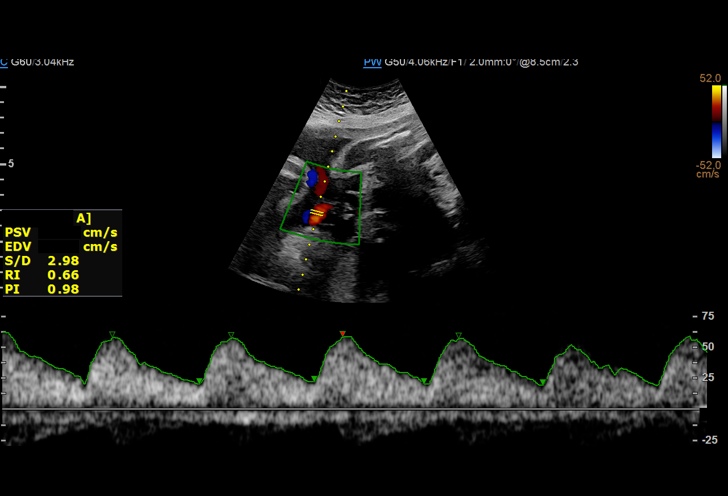
[im 28/31]
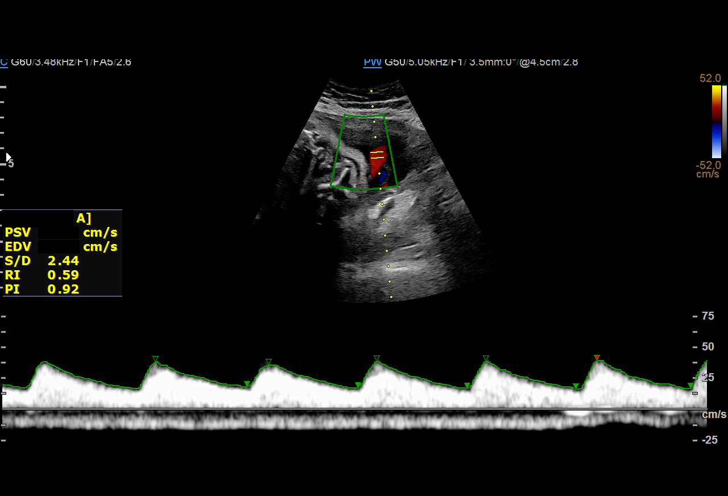
[im 31/31]
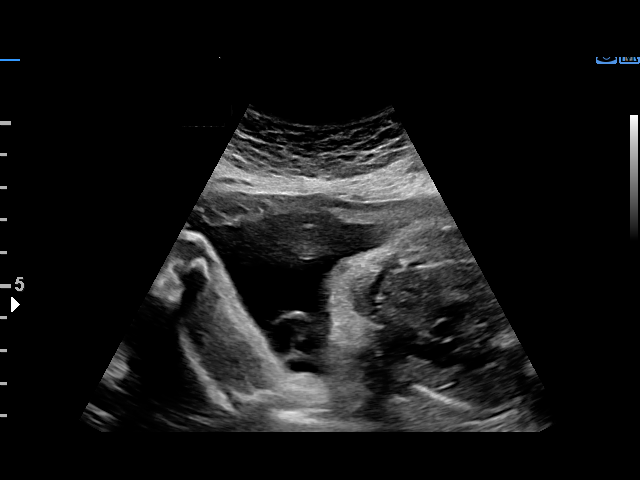

[14 of 28 positions shown; findings below may reference images not displayed]

[REDACTED]
                                                            Ave., [HOSPITAL]

Indications

 33 weeks gestation of pregnancy
 Maternal care for known or suspected poor      [8T]
 fetal growth, third trimester, fetus 1 IUGR
 Advanced maternal age multigravida 35+,        [8T]
 third trimester
 Poor obstetric history: Previous preterm       [8T]
 delivery at 34 weeks
 Ovarian cyst (simple- LT)                      [8T] [8T]
Fetal Evaluation

 Num Of Fetuses:         1
 Preg. Location:         Intrauterine
 Fetal Heart Rate(bpm):  144
 Cardiac Activity:       Observed
 Presentation:           Cephalic
 Placenta:               Posterior

 Amniotic Fluid
 AFI FV:      Within normal limits

 AFI Sum(cm)     %Tile       Largest Pocket(cm)
 12.6            37          5

 RUQ(cm)       RLQ(cm)       LUQ(cm)        LLQ(cm)
 5
Biophysical Evaluation

 Amniotic F.V:   Within normal limits       F. Tone:        Observed
 F. Movement:    Observed                   Score:          [DATE]
 F. Breathing:   Observed
Biometry

 LV:        3.5  mm
OB History

 Gravidity:    3         Prem:   1         SAB:   1
 Living:       1
Gestational Age

 LMP:           33w 0d        Date:  [DATE]                 EDD:   [DATE]
 Best:          33w 0d     Det. By:  LMP  ([DATE])          EDD:   [DATE]
Anatomy

 Cranium:               Appears normal         Aortic Arch:            Not well visualized
 Cavum:                 Appears normal         Ductal Arch:            Previously seen
 Ventricles:            Appears normal         Diaphragm:              Previously seen
 Choroid Plexus:        Previously seen        Stomach:                Appears normal, left
                                                                       sided
 Cerebellum:            Previously seen        Abdomen:                Previously seen
 Posterior Fossa:       Previously seen        Abdominal Wall:         Previously seen
 Nuchal Fold:           Not applicable (>20    Cord Vessels:           Previously seen
                        wks GA)
 Face:                  Profile appears        Kidneys:                Appear normal
                        normal
 Lips:                  Appears normal         Bladder:                Appears normal
 Thoracic:              Previously seen        Spine:                  Previously seen
 Heart:                 Limited Views          Upper Extremities:      Visualized
 RVOT:                  Previously seen        Lower Extremities:      Visualized
 LVOT:                  Previously seen

 Other:  Fetus appears to be female. Heels and 5th digit not well visualized.
         VC, 3VV and 3VTV previously visualized.
Doppler - Fetal Vessels

 Umbilical Artery
  S/D     %tile      RI    %tile      PI    %tile     PSV    ADFV    RDFV
                                                    (cm/s)
  2.62       51    0.62       58    0.[REDACTED]      No      No

Cervix Uterus Adnexa

 Cervix
 Not visualized (advanced GA >[8T])
 Right Ovary
 Not visualized.

 Left Ovary
 Not visualized.
Impression

 Antenatal testing due to IUGR with an EFW 4th% with an AC
 < 1th%.
 Biophysical profile [DATE] with good fetal movement and
 amniotic fluid volume
 UA Dopplers are normal with no evidence of AEDF or REDF

 I discussed with Ms. SMOOT exam. In addition, I
 reviewed an expected time of delivery between 37-38 weeks.
 She has a growth in 2 weeks, therefore we will assess timing
 of delivery at that time. If UA Dopplers become abnormal or if
 EFW < 3% we will recommend delivery at 37 weeks.
Recommendations

 Continue weekly testing with UA Dopplers as previously
 scheduled
 Repeat growth in 2 weeks.

## 2020-12-27 NOTE — Progress Notes (Signed)
MFM Brief Note  Tina Larsen is a 35 yo G3P1 at 34 weeks who is here for antenatal testing due to IUGR with an EFW 4th% with an AC < 1th%.  Biophysical profile 8/8 with good fetal movement and amniotic fluid volume UA Dopplers are normal with no evidence of AEDF or REDF   I discussed with Tina Larsen today's exam. In addition, I reviewed an expected time of delivery between 37-38 weeks. She has a growth in 2 weeks, therefore we will assess timing of delivery at that time. If UA Dopplers become abnormal or if EFW < 3% we will recommend delivery at 37 weeks.  Continue weekly testing with UA Dopplers as previously scheduled Repeat growth in 2 weeks  I spent 20 minutes with > 50% in face to face consultation  Tina Olive, MD

## 2021-01-03 ENCOUNTER — Other Ambulatory Visit: Payer: Self-pay | Admitting: *Deleted

## 2021-01-03 ENCOUNTER — Other Ambulatory Visit: Payer: Self-pay

## 2021-01-03 ENCOUNTER — Ambulatory Visit: Payer: BLUE CROSS/BLUE SHIELD | Attending: Obstetrics and Gynecology

## 2021-01-03 ENCOUNTER — Ambulatory Visit: Payer: BLUE CROSS/BLUE SHIELD | Admitting: *Deleted

## 2021-01-03 VITALS — BP 116/75 | HR 92

## 2021-01-03 DIAGNOSIS — O3483 Maternal care for other abnormalities of pelvic organs, third trimester: Secondary | ICD-10-CM

## 2021-01-03 DIAGNOSIS — N83209 Unspecified ovarian cyst, unspecified side: Secondary | ICD-10-CM | POA: Insufficient documentation

## 2021-01-03 DIAGNOSIS — O365931 Maternal care for other known or suspected poor fetal growth, third trimester, fetus 1: Secondary | ICD-10-CM | POA: Insufficient documentation

## 2021-01-03 DIAGNOSIS — O365930 Maternal care for other known or suspected poor fetal growth, third trimester, not applicable or unspecified: Secondary | ICD-10-CM | POA: Insufficient documentation

## 2021-01-03 DIAGNOSIS — O09219 Supervision of pregnancy with history of pre-term labor, unspecified trimester: Secondary | ICD-10-CM | POA: Diagnosis present

## 2021-01-03 DIAGNOSIS — O09523 Supervision of elderly multigravida, third trimester: Secondary | ICD-10-CM | POA: Insufficient documentation

## 2021-01-03 DIAGNOSIS — O09213 Supervision of pregnancy with history of pre-term labor, third trimester: Secondary | ICD-10-CM

## 2021-01-03 DIAGNOSIS — N83202 Unspecified ovarian cyst, left side: Secondary | ICD-10-CM

## 2021-01-03 DIAGNOSIS — O3480 Maternal care for other abnormalities of pelvic organs, unspecified trimester: Secondary | ICD-10-CM | POA: Insufficient documentation

## 2021-01-03 DIAGNOSIS — Z3A34 34 weeks gestation of pregnancy: Secondary | ICD-10-CM

## 2021-01-03 IMAGING — US US MFM UA CORD DOPPLER
1 series · 12 of 28 positions shown · non-contrast
Comparison: none

[Series 1: us mfm ua cord doppler · 30 acquisitions, 12 frames shown]
[im 2/30]
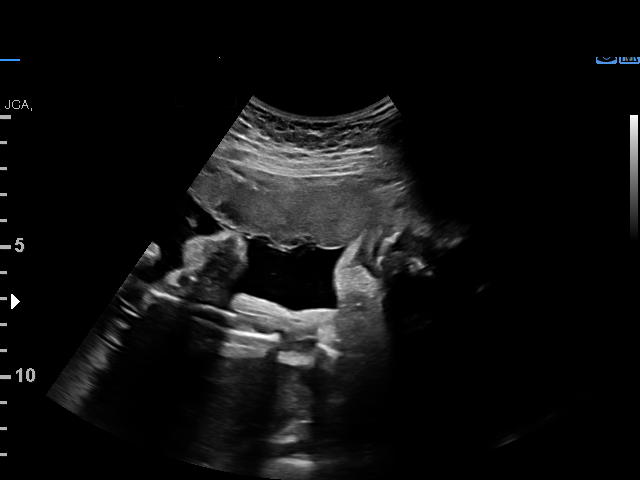
[im 4/30]
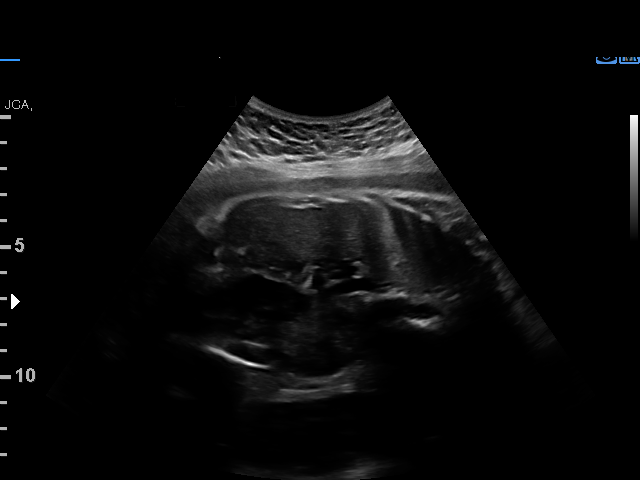
[im 6/30]
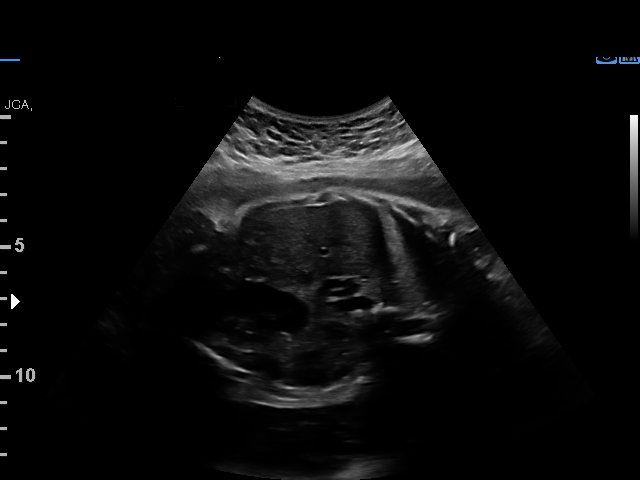
[im 9/30]
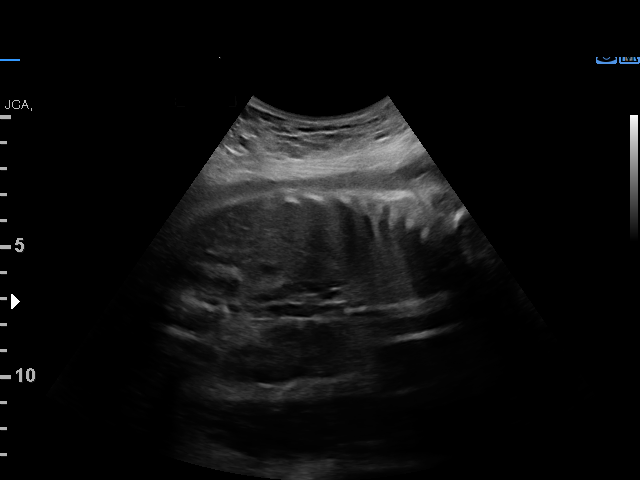
[im 11/30]
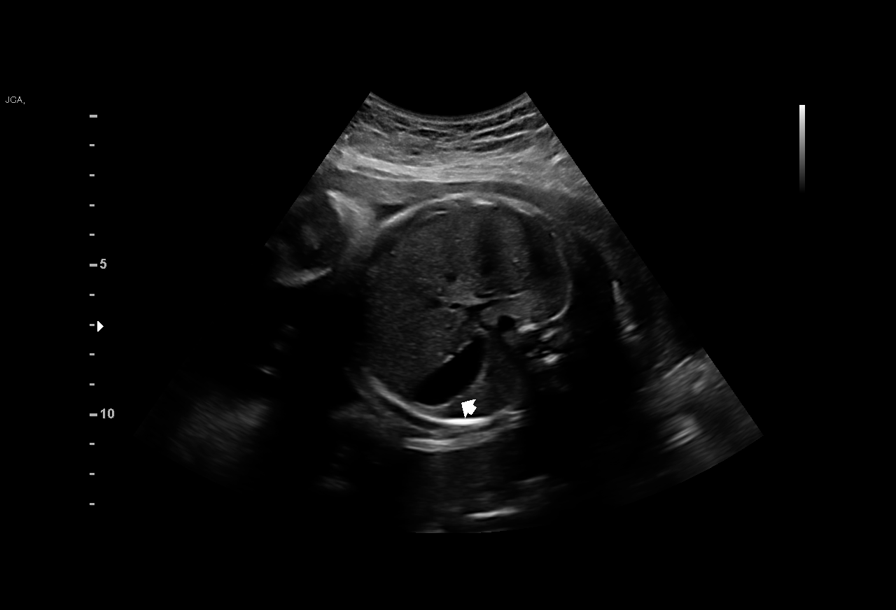
[im 13/30]
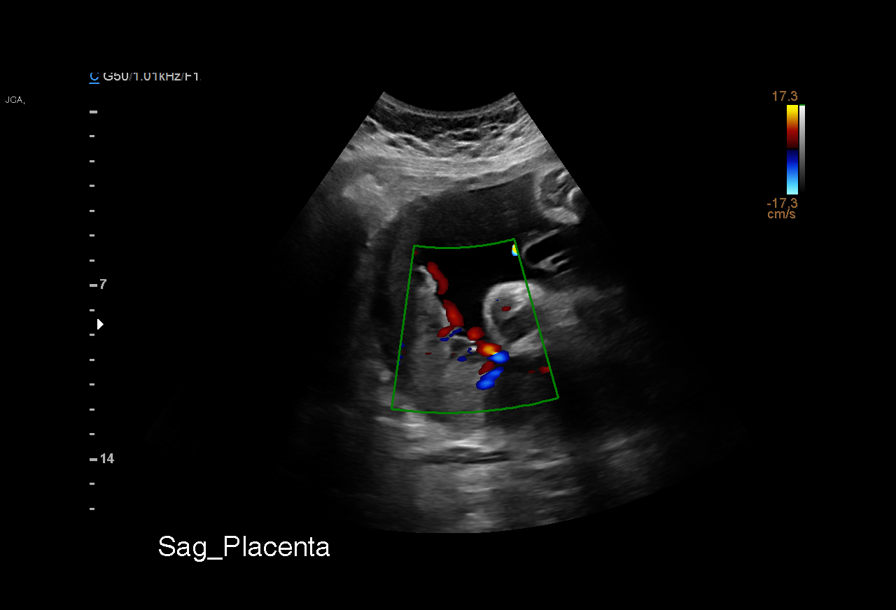
[im 17/30]
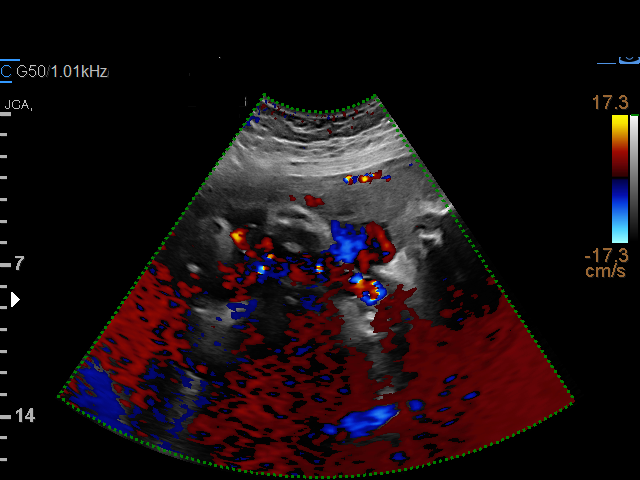
[im 19/30]
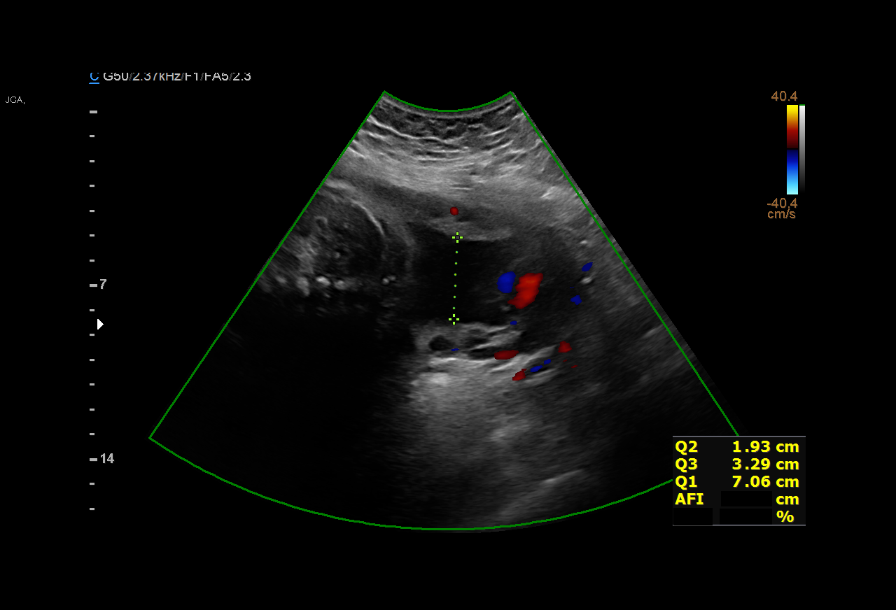
[im 21/30]
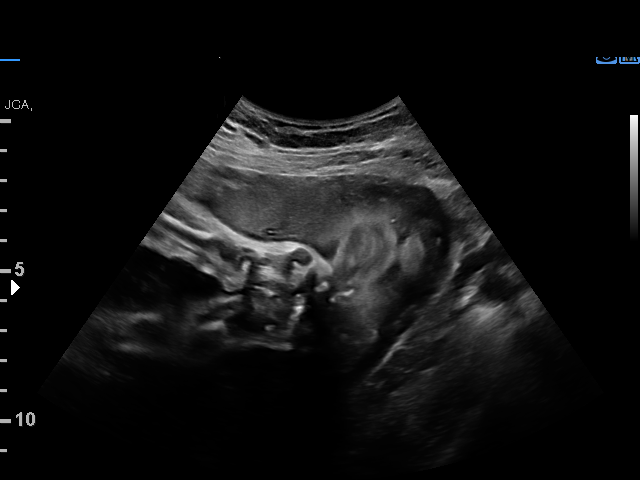
[im 24/30]
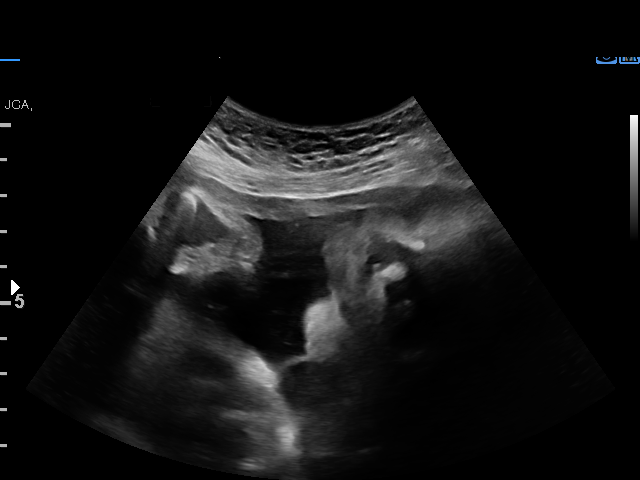
[im 26/30]
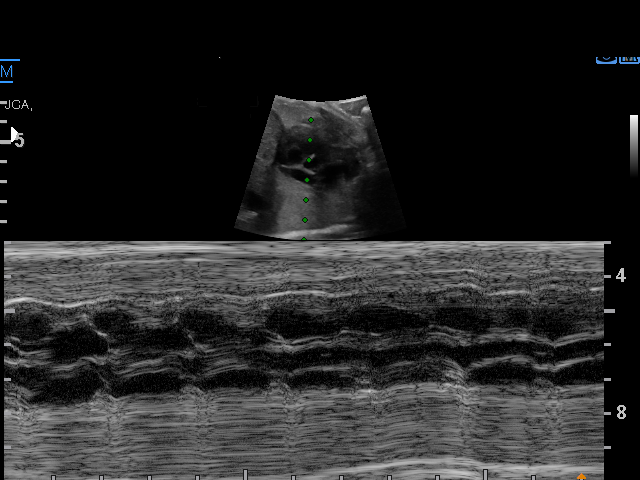
[im 28/30]
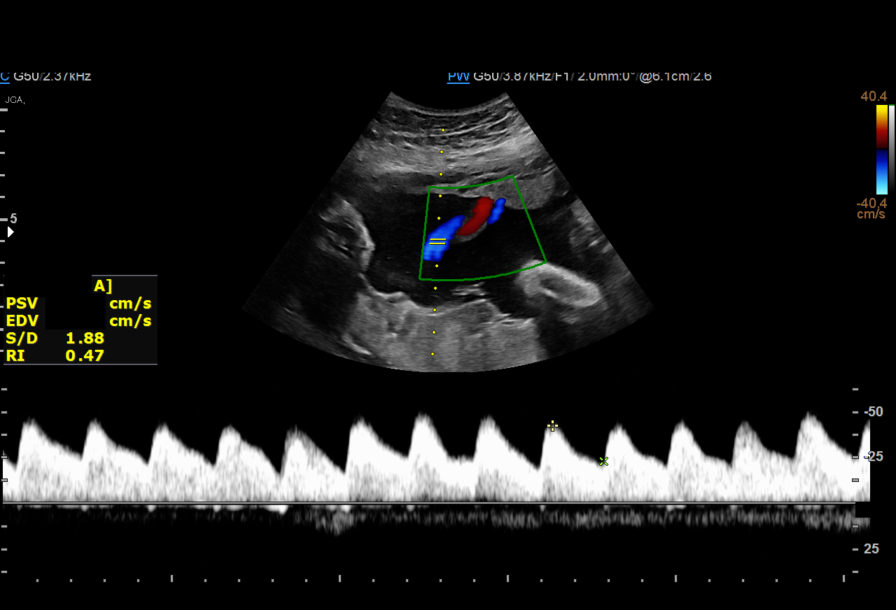

[12 of 28 positions shown; findings below may reference images not displayed]

[REDACTED]
                                                            Ave., [HOSPITAL]

Indications

 Maternal care for known or suspected poor
 fetal growth, third trimester, fetus 1 IUGR
 Advanced maternal age multigravida 35+,
 third trimester
 Poor obstetric history: Previous preterm
 delivery at 34 weeks
 Ovarian cyst (simple- LT)
 34 weeks gestation of pregnancy
Fetal Evaluation

 Num Of Fetuses:         1
 Fetal Heart Rate(bpm):  169
 Cardiac Activity:       Observed
 Presentation:           Cephalic
 Placenta:               Anterior
 P. Cord Insertion:      Previously Visualized

 Amniotic Fluid
 AFI FV:      Within normal limits

 AFI Sum(cm)     %Tile       Largest Pocket(cm)
 17.26           63

 RUQ(cm)       RLQ(cm)       LUQ(cm)        LLQ(cm)

Biophysical Evaluation

 Amniotic F.V:   Pocket => 2 cm             F. Tone:        Observed
 F. Movement:    Observed                   Score:          [DATE]
 F. Breathing:   Observed
OB History

 Gravidity:    3         Prem:   1         SAB:   1
 Living:       1
Gestational Age

 LMP:           34w 0d        Date:  05/10/20                 EDD:   02/14/21
 Best:          34w 0d     Det. By:  LMP  (05/10/20)          EDD:   02/14/21
Doppler - Fetal Vessels

 Umbilical Artery
  S/D     %tile      RI    %tile                             ADFV    RDFV
  2.07       20    0.52       18                                No      No

Comments

 This patient was seen due to an IUGR fetus.  She denies any
 problems since her last exam.  She reports feeling vigorous
 fetal movements throughout the day.
 A biophysical profile performed today was [DATE].
 There was normal amniotic fluid noted on today's ultrasound
 exam.
 Doppler studies of the umbilical arteries performed due to
 fetal growth restriction showed a normal S/D ratio of 2.07.
 There were no signs of absent or reversed end-diastolic flow
 noted today.
 A follow-up exam was scheduled in 1 week.

## 2021-01-10 ENCOUNTER — Other Ambulatory Visit: Payer: Self-pay

## 2021-01-10 ENCOUNTER — Encounter: Payer: Self-pay | Admitting: *Deleted

## 2021-01-10 ENCOUNTER — Ambulatory Visit: Payer: Commercial Managed Care - PPO | Attending: Obstetrics and Gynecology

## 2021-01-10 ENCOUNTER — Ambulatory Visit: Payer: Commercial Managed Care - PPO | Admitting: *Deleted

## 2021-01-10 VITALS — BP 113/72 | HR 90

## 2021-01-10 DIAGNOSIS — O09523 Supervision of elderly multigravida, third trimester: Secondary | ICD-10-CM

## 2021-01-10 DIAGNOSIS — O365930 Maternal care for other known or suspected poor fetal growth, third trimester, not applicable or unspecified: Secondary | ICD-10-CM

## 2021-01-10 DIAGNOSIS — N83202 Unspecified ovarian cyst, left side: Secondary | ICD-10-CM

## 2021-01-10 DIAGNOSIS — Z3A35 35 weeks gestation of pregnancy: Secondary | ICD-10-CM

## 2021-01-10 DIAGNOSIS — O09213 Supervision of pregnancy with history of pre-term labor, third trimester: Secondary | ICD-10-CM

## 2021-01-10 DIAGNOSIS — O3483 Maternal care for other abnormalities of pelvic organs, third trimester: Secondary | ICD-10-CM

## 2021-01-10 IMAGING — US US MFM UA CORD DOPPLER
1 series · 13 of 28 positions shown · non-contrast
Comparison: none

[Series 1: us mfm ua cord doppler · 13 of 43 slices shown]
[im 2/43]
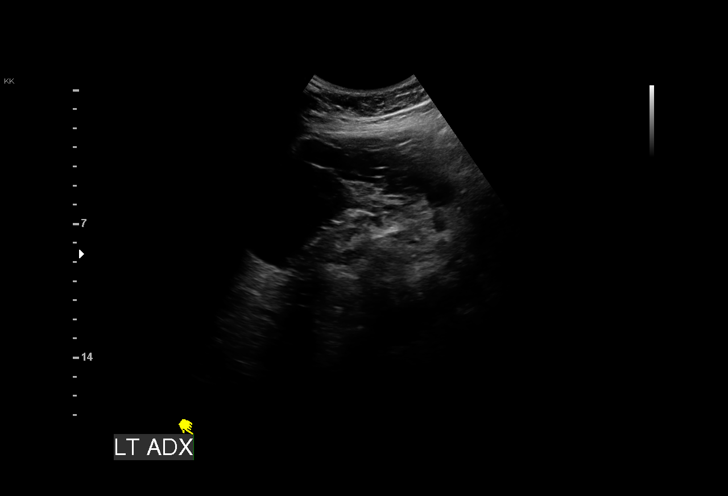
[im 5/43]
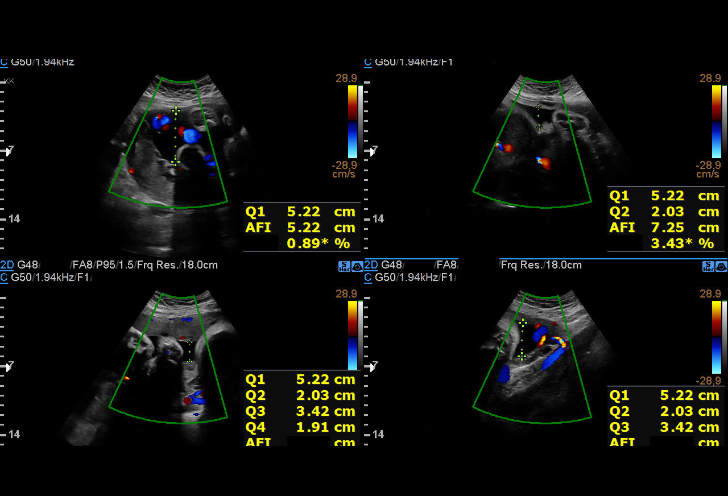
[im 8/43]
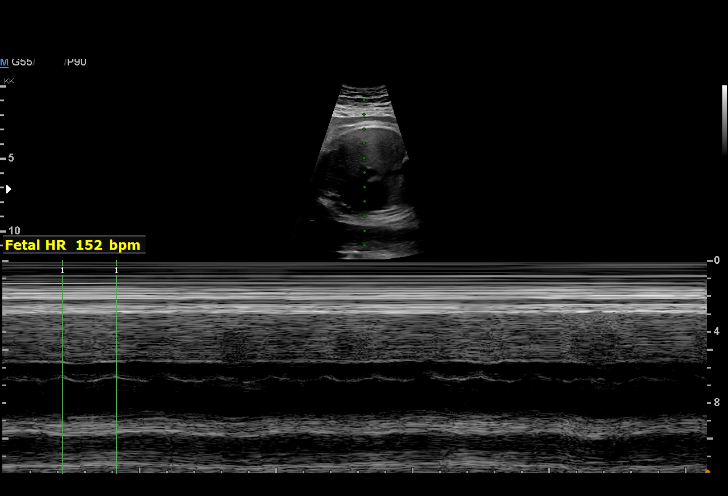
[im 11/43]
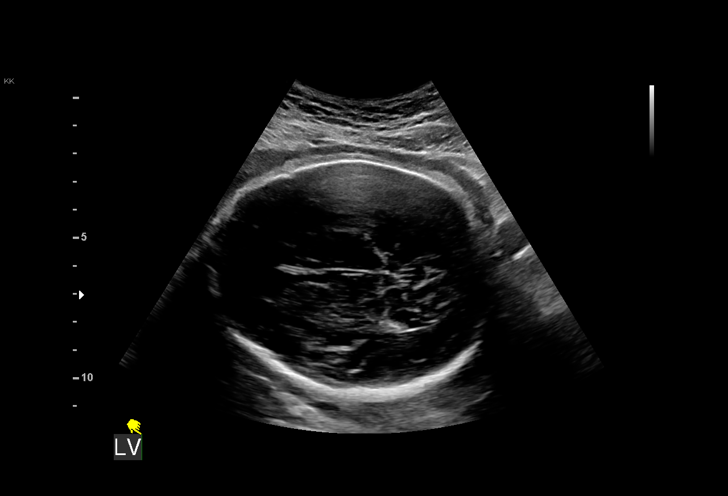
[im 15/43]
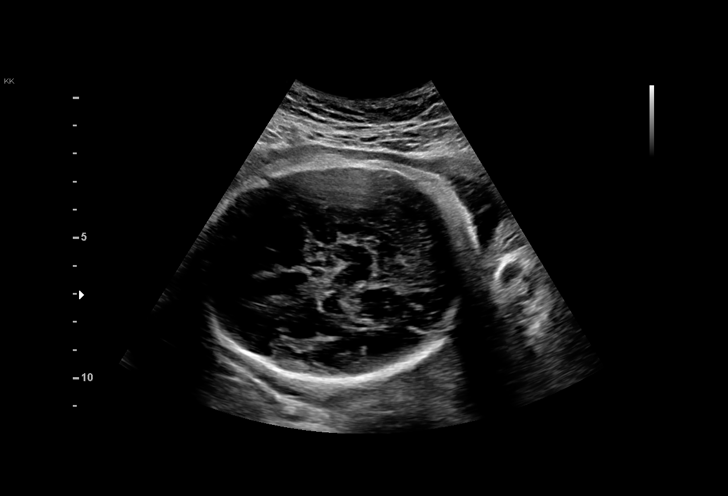
[im 18/43]
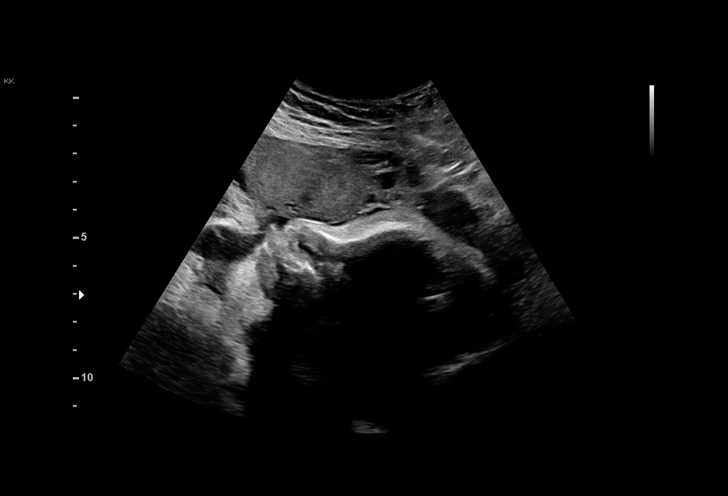
[im 22/43]
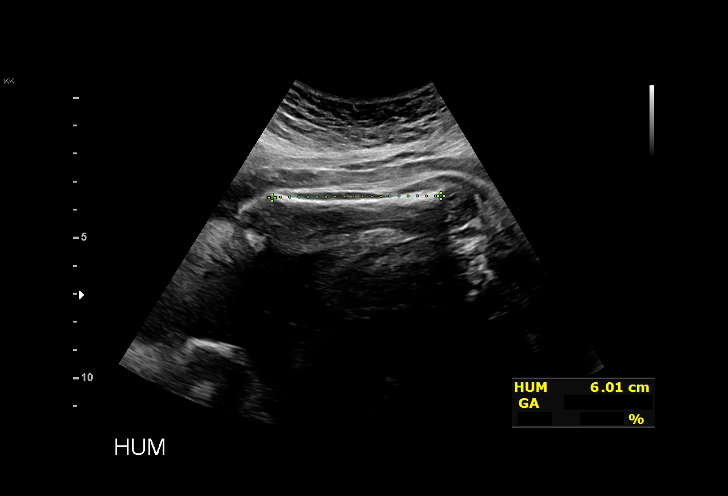
[im 25/43]
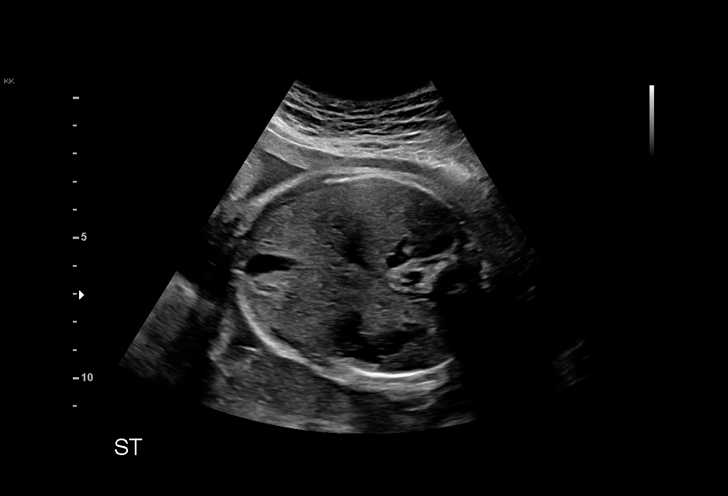
[im 29/43]
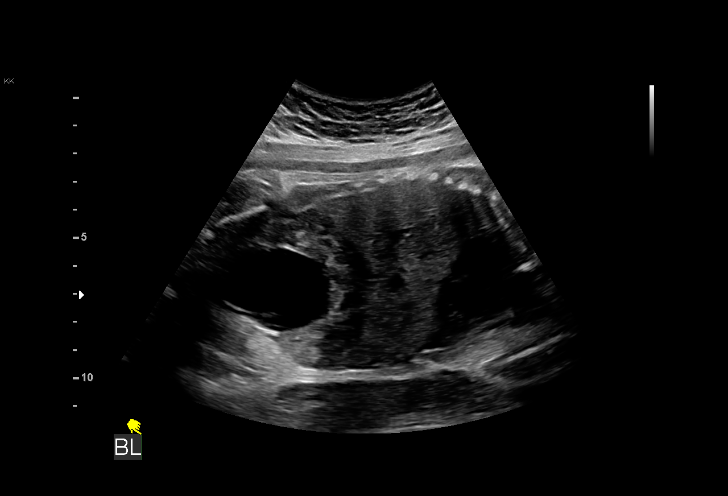
[im 32/43]
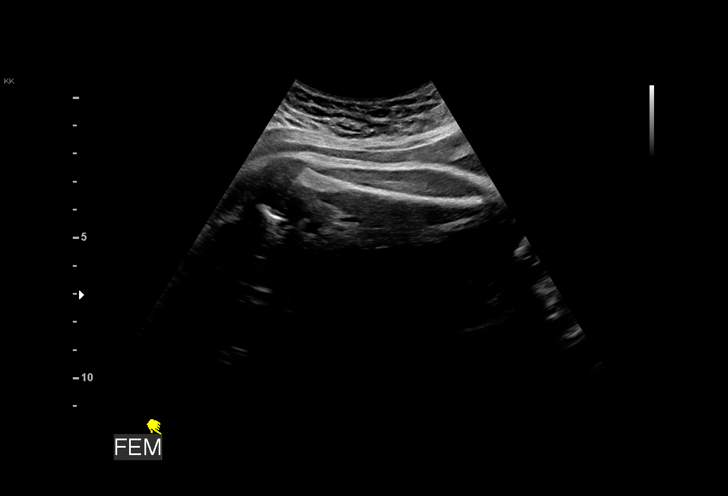
[im 35/43]
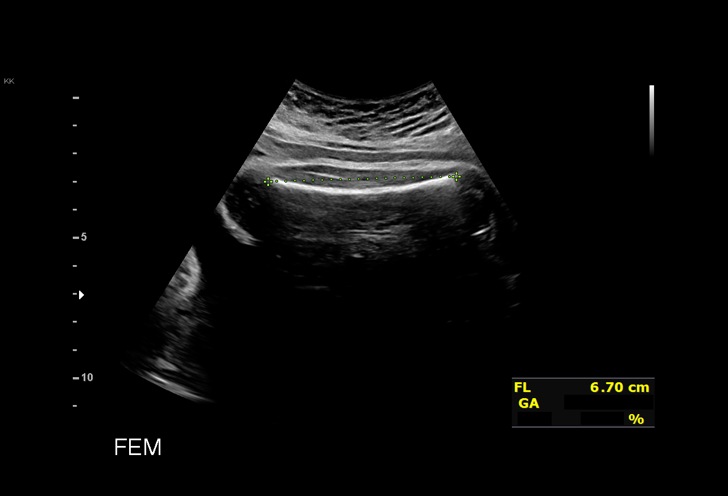
[im 38/43]
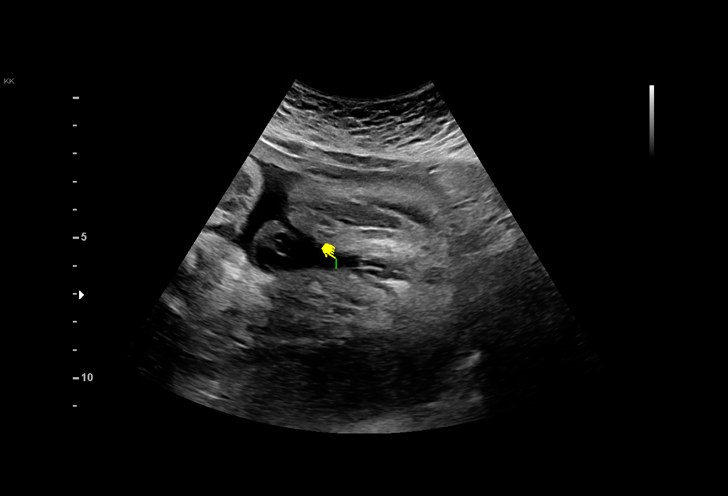
[im 41/43]
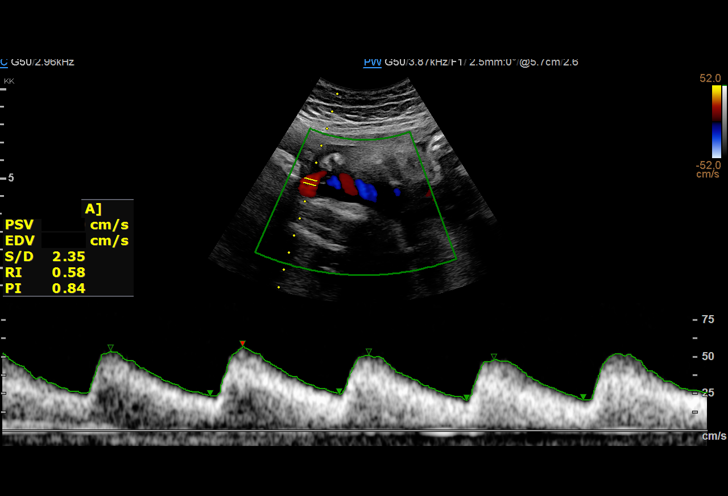

[13 of 28 positions shown; findings below may reference images not displayed]

[REDACTED]
                                                            Ave., [HOSPITAL]

Indications

 Maternal care for known or suspected poor
 fetal growth, third trimester, fetus 1 IUGR
 35 weeks gestation of pregnancy
 Advanced maternal age multigravida 35+,
 third trimester
 Poor obstetric history: Previous preterm
 delivery at 34 weeks
 Ovarian cyst (simple- LT)
Fetal Evaluation

 Num Of Fetuses:         1
 Preg. Location:         Intrauterine
 Fetal Heart Rate(bpm):  152
 Cardiac Activity:       Observed
 Presentation:           Cephalic
 Placenta:               Posterior

 Amniotic Fluid
 AFI FV:      Within normal limits

 AFI Sum(cm)     %Tile       Largest Pocket(cm)
 12.58           40
 RUQ(cm)       RLQ(cm)       LUQ(cm)        LLQ(cm)

Biophysical Evaluation

 Amniotic F.V:   Within normal limits       F. Tone:        Observed
 F. Movement:    Observed                   Score:          [DATE]
 F. Breathing:   Observed
Biometry

 BPD:      78.6  mm     G. Age:  31w 4d        < 1  %    CI:        73.37   %    70 - 86
                                                         FL/HC:      23.1   %    20.1 -
 HC:      291.6  mm     G. Age:  32w 1d        < 1  %    HC/AC:      1.06        0.93 -
 AC:      274.3  mm     G. Age:  31w 4d        < 1  %    FL/BPD:     85.8   %    71 - 87
 FL:       67.4  mm     G. Age:  34w 5d         33  %    FL/AC:      24.6   %    20 - 24
 HUM:      60.1  mm     G. Age:  35w 0d         62  %

 LV:        3.6  mm

 Est. FW:    1664  gm      4 lb 6 oz    3.4  %
OB History

 Gravidity:    3         Prem:   1         SAB:   1
 Living:       1
Gestational Age

 LMP:           35w 0d        Date:  05/10/20                 EDD:   02/14/21
 U/S Today:     32w 4d                                        EDD:   03/03/21
 Best:          35w 0d     Det. By:  LMP  (05/10/20)          EDD:   02/14/21
Anatomy

 Cranium:               Appears normal         Aortic Arch:            Not well visualized
 Cavum:                 Appears normal         Ductal Arch:            Previously seen
 Ventricles:            Appears normal         Diaphragm:              Previously seen
 Choroid Plexus:        Previously seen        Stomach:                Appears normal, left
                                                                       sided
 Cerebellum:            Previously seen        Abdomen:                Previously seen
 Posterior Fossa:       Previously seen        Abdominal Wall:         Previously seen
 Nuchal Fold:           Not applicable (>20    Cord Vessels:           Appears normal (3
                        wks GA)                                        vessel cord)
 Face:                  Profile appears        Kidneys:                Appear normal
                        normal
 Lips:                  Appears normal         Bladder:                Appears normal
 Thoracic:              Previously seen        Spine:                  Previously seen
 Heart:                 Previously seen        Upper Extremities:      Visualized
 RVOT:                  Previously seen        Lower Extremities:      Visualized
 LVOT:                  Previously seen

 Other:  Fetus appears to be female. Heels and 5th digit not well visualized.
         VC, 3VV and 3VTV previously visualized.
Doppler - Fetal Vessels

 Umbilical Artery
  S/D     %tile      RI    %tile      PI    %tile            ADFV    RDFV
  2.25       35    0.56       40    0.79       39               No      No
Cervix Uterus Adnexa

 Cervix
 Not visualized (advanced GA >66wks)

 Uterus
 No abnormality visualized.

 Right Ovary
 Not visualized.

 Left Ovary
 Not visualized.

 Cul De Sac
 No free fluid seen.

 Adnexa
 No abnormality visualized.
Comments

 This patient was seen for a follow up growth scan due to fetal
 growth restriction.  She denies any problems since her last
 exam and reports feeling vigorous fetal movements
 throughout the day.
 On today's exam, the EFW measures at the 3rd percentile for
 her gestational age indicating fetal growth restriction.  The
 fetus has grown 1 pound over the past 3 weeks.  There was
 normal amniotic fluid noted.
 A biophysical profile performed today due to fetal growth
 restriction was [DATE].
 Doppler studies of the umbilical arteries showed a normal
 S/D ratio of 2.25.  There were no signs of absent or reversed
 end-diastolic flow.
 Due to fetal growth restriction, we will continue to follow her
 with weekly fetal testing and umbilical artery Doppler studies.
 Due to fetal growth restriction, delivery is recommended at
 around 37 weeks.

 She will return in 1 week for another biophysical profile and
 umbilical artery Doppler study.
Recommendations

 Continue weekly fetal testing until delivery
 Delivery at around 37 weeks

## 2021-01-17 ENCOUNTER — Ambulatory Visit: Payer: BLUE CROSS/BLUE SHIELD | Admitting: *Deleted

## 2021-01-17 ENCOUNTER — Telehealth: Payer: Self-pay

## 2021-01-17 ENCOUNTER — Ambulatory Visit (HOSPITAL_BASED_OUTPATIENT_CLINIC_OR_DEPARTMENT_OTHER): Payer: BLUE CROSS/BLUE SHIELD

## 2021-01-17 ENCOUNTER — Other Ambulatory Visit: Payer: Self-pay

## 2021-01-17 ENCOUNTER — Encounter: Payer: Self-pay | Admitting: *Deleted

## 2021-01-17 VITALS — BP 120/80 | HR 81

## 2021-01-17 DIAGNOSIS — N83202 Unspecified ovarian cyst, left side: Secondary | ICD-10-CM

## 2021-01-17 DIAGNOSIS — O365930 Maternal care for other known or suspected poor fetal growth, third trimester, not applicable or unspecified: Secondary | ICD-10-CM

## 2021-01-17 DIAGNOSIS — O09213 Supervision of pregnancy with history of pre-term labor, third trimester: Secondary | ICD-10-CM

## 2021-01-17 DIAGNOSIS — Z3A36 36 weeks gestation of pregnancy: Secondary | ICD-10-CM

## 2021-01-17 DIAGNOSIS — O3483 Maternal care for other abnormalities of pelvic organs, third trimester: Secondary | ICD-10-CM | POA: Diagnosis not present

## 2021-01-17 DIAGNOSIS — O09523 Supervision of elderly multigravida, third trimester: Secondary | ICD-10-CM

## 2021-01-17 IMAGING — US US MFM FETAL BPP W/O NON-STRESS
2 series · 14 of 28 positions shown · non-contrast
Comparison: none

[Series 1: us mfm fetal bpp w/o non-stress · 29 acquisitions, 13 frames shown (1 of 2)]
[im 2/29]
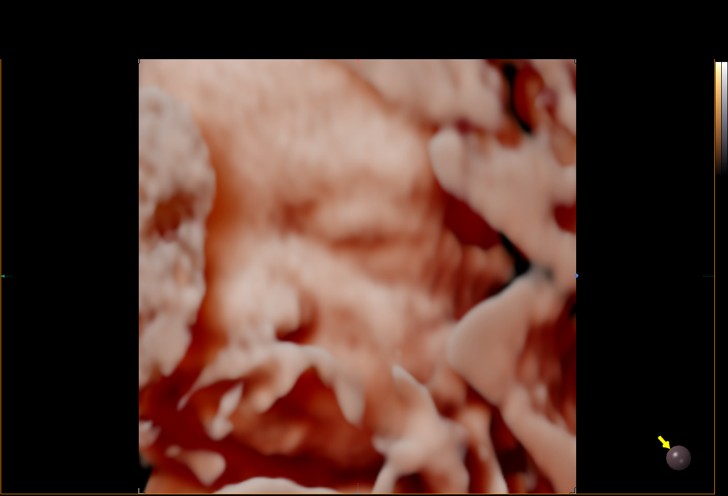
[im 4/29]
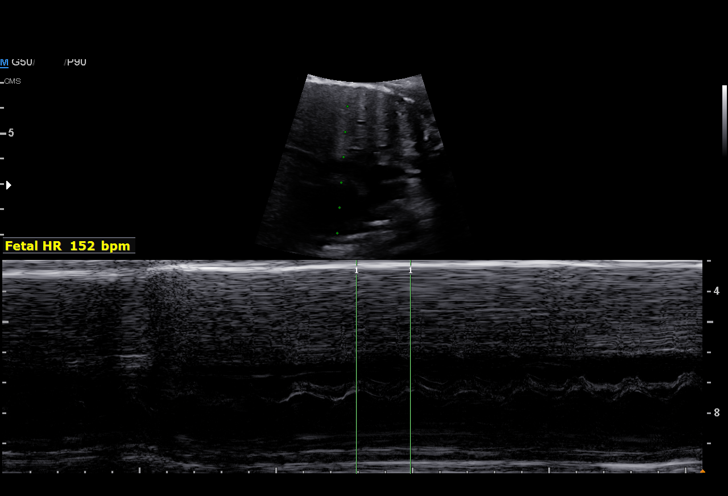
[im 6/29]
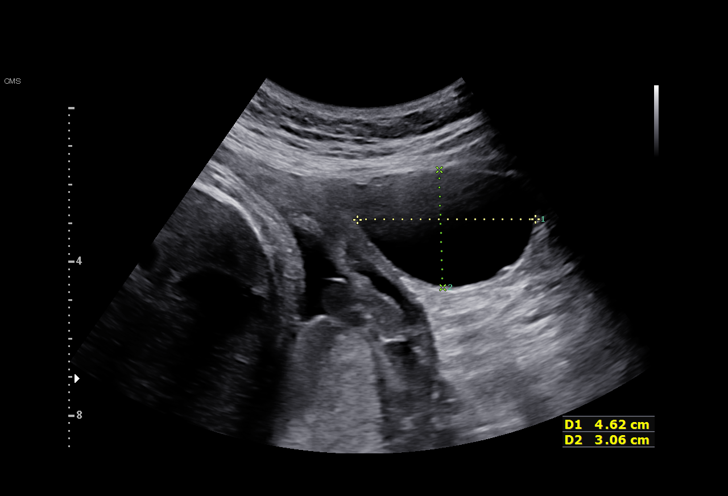
[im 8/29]
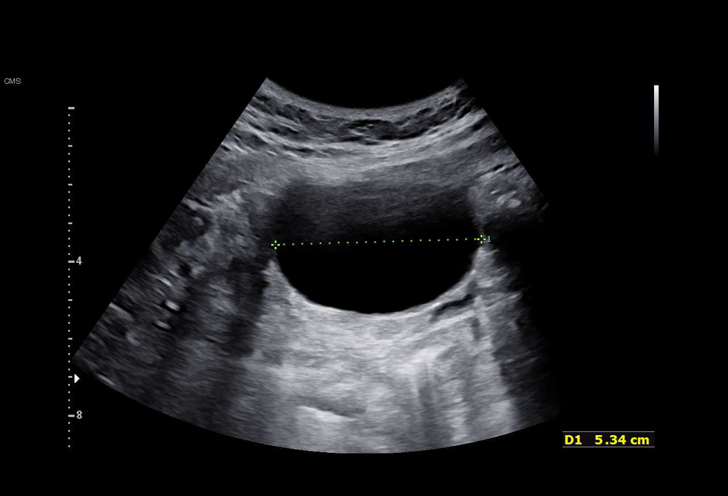
[im 11/29]
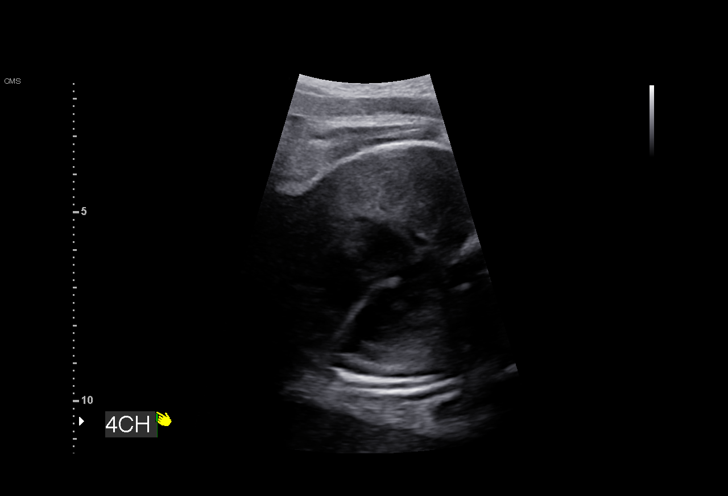
[im 13/29]
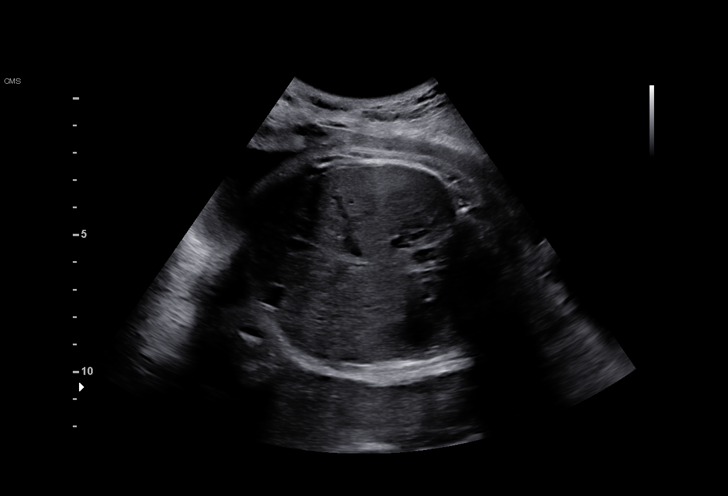
[im 15/29]
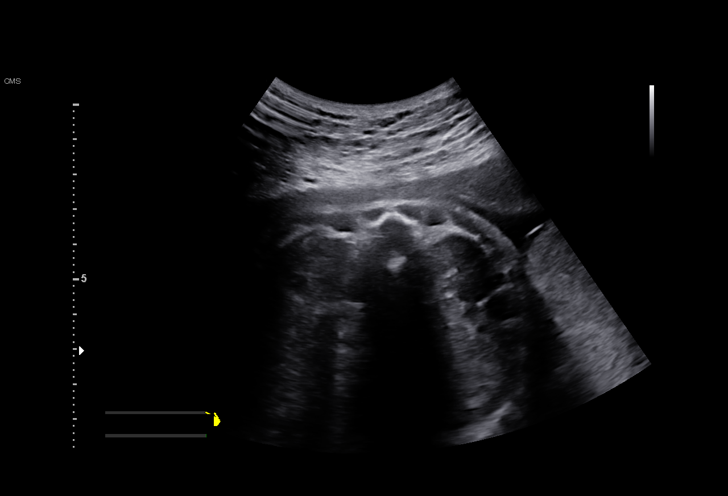
[im 17/29]
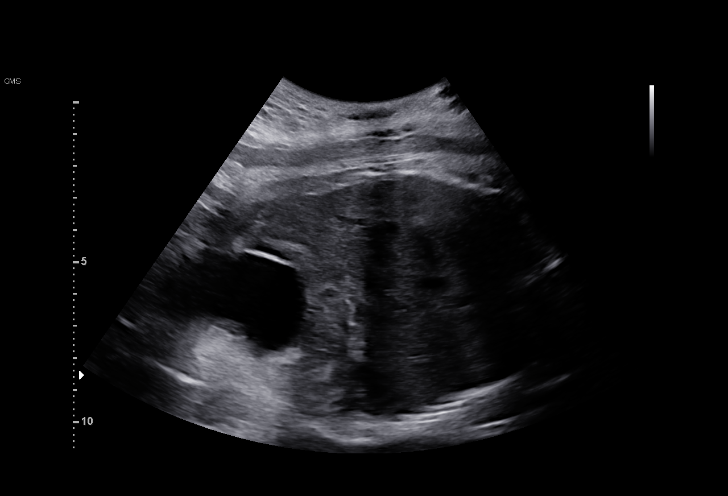
[im 20/29]
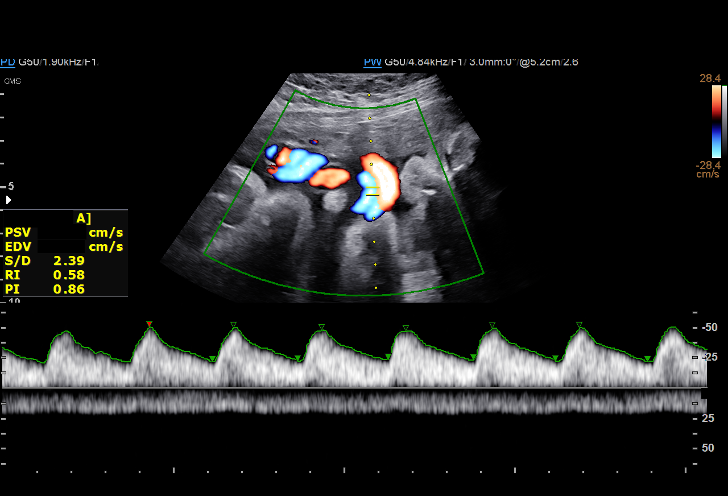
[im 22/29]
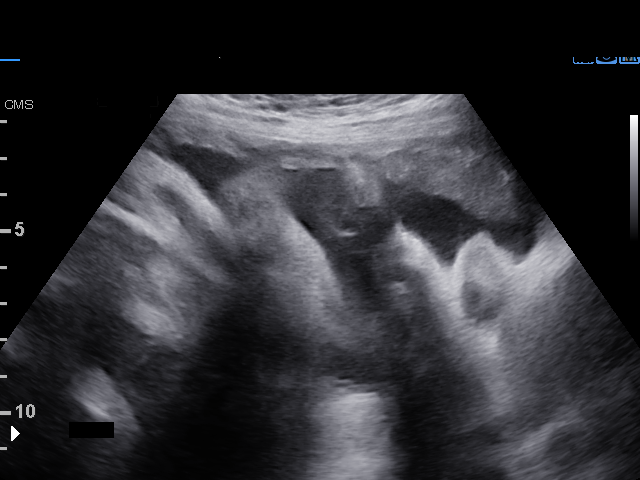
[im 24/29]
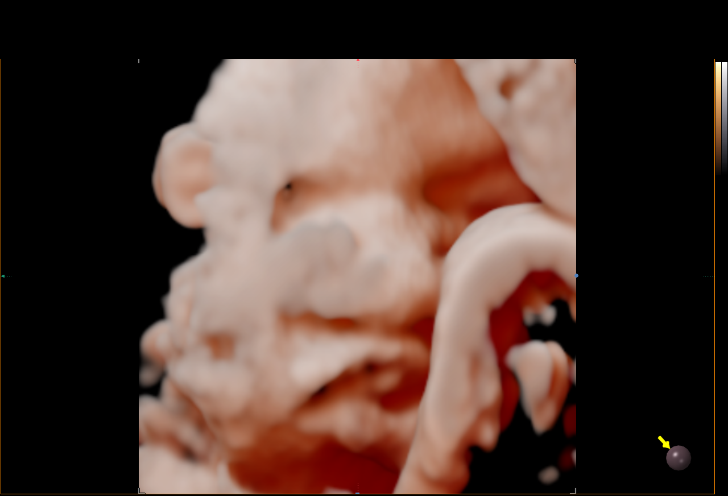
[im 26/29]
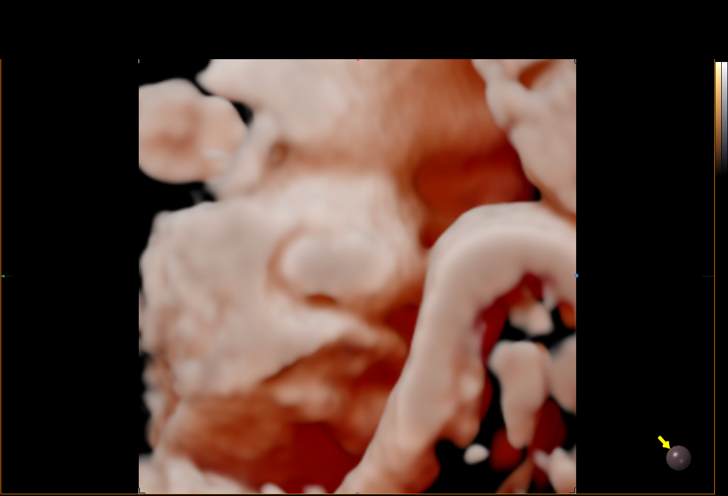
[im 29/29]
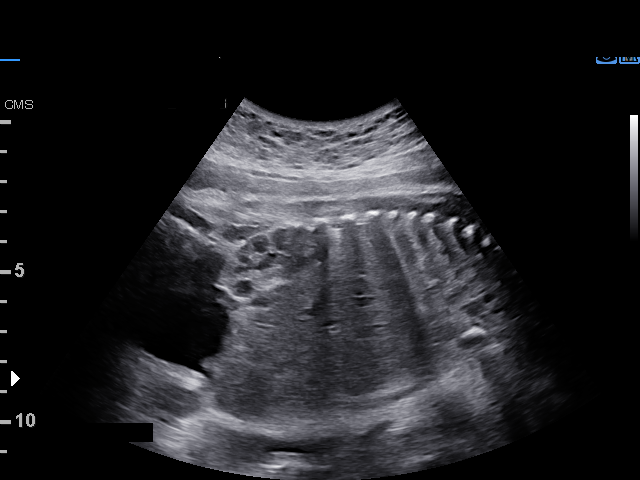

[Series 3: us mfm fetal bpp w/o non-stress · 1 of 2 slices shown (2 of 2)]
[im 2/2]
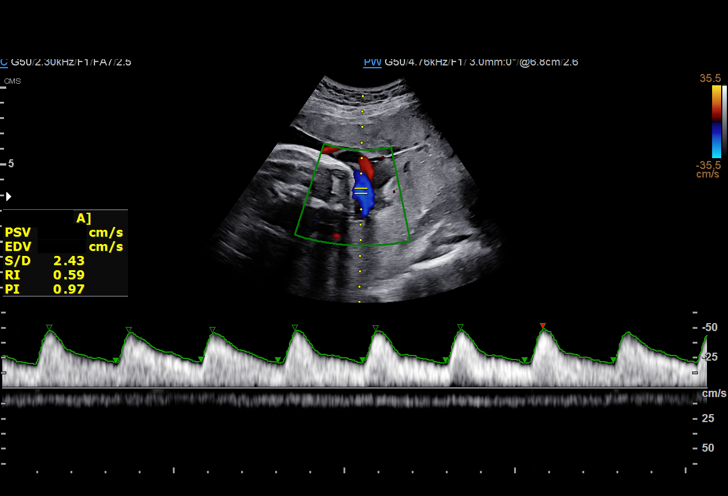

[14 of 28 positions shown; findings below may reference images not displayed]

[REDACTED]
                                                            Ave., [HOSPITAL]

Indications

 Maternal care for known or suspected poor
 fetal growth, third trimester, fetus 1 IUGR
 Advanced maternal age multigravida 35+,
 third trimester
 36 weeks gestation of pregnancy
 Poor obstetric history: Previous preterm
 delivery at 34 weeks
 Ovarian cyst (simple- LT)
Fetal Evaluation

 Num Of Fetuses:         1
 Fetal Heart Rate(bpm):  152
 Cardiac Activity:       Observed
 Presentation:           Cephalic
 Placenta:               Posterior
 P. Cord Insertion:      Previously Visualized

 Amniotic Fluid
 AFI FV:      Within normal limits

 AFI Sum(cm)     %Tile       Largest Pocket(cm)
 14.66           53

 RUQ(cm)       RLQ(cm)       LUQ(cm)        LLQ(cm)

Biophysical Evaluation

 Amniotic F.V:   Within normal limits       F. Tone:        Observed
 F. Movement:    Observed                   Score:          [DATE]
 F. Breathing:   Observed
Biometry

 LV:        2.6  mm
OB History

 Gravidity:    3         Prem:   1         SAB:   1
 Living:       1
Gestational Age

 LMP:           36w 0d        Date:  05/10/20                 EDD:   02/14/21
 Best:          36w 0d     Det. By:  LMP  (05/10/20)          EDD:   02/14/21
Anatomy

 Ventricles:            Appears normal         Kidneys:                Appear normal
 Heart:                 Appears normal         Bladder:                Appears normal
                        (4CH, axis, and
                        situs)
 Stomach:               Appears normal, left
                        sided
Doppler - Fetal Vessels

 Umbilical Artery
  S/D     %tile      RI    %tile      PI    %tile     PSV    ADFV    RDFV
                                                    (cm/s)
  2.31       44    0.57       50     0.8       45    36.68      No      No

Cervix Uterus Adnexa

 Cervix
 Not visualized (advanced GA >92wks)

 Right Ovary
 Visualized.

 Left Ovary
 Cyst- 4.6 x 3 x
Comments

 This patient was seen due to an IUGR fetus.  The overall
 EFW measured last week was at the 3rd percentile,
 indicating severe IUGR.
  She denies any problems since her last exam.  She reports
 feeling vigorous fetal movements throughout the day.
 A biophysical profile performed today was [DATE].
 There was normal amniotic fluid noted on today's ultrasound
 exam.
 Doppler studies of the umbilical arteries performed due to
 fetal growth restriction showed a normal S/D ratio of 2.31.
 There were no signs of absent or reversed end-diastolic flow
 noted today.
 A simple appearing 4 to 5 cm left adnexal cyst continues to
 be noted today.
 Another biophysical profile and umbilical artery Doppler study
 was scheduled in 1 week.

 Due to severe IUGR, an induction of labor should be
 scheduled for her at around 37 weeks (at the end of next
 week).

## 2021-01-17 NOTE — Telephone Encounter (Signed)
Patient given copy of future appointments - scheduled for induction Monday 8/29, however has ultrasound scheduled for Wednesday 8/31.  Asked patient to call OB Office to verify induction date.  If she truly is being induced on Monday, patient to call our office to cancel Wednesdays appointment.

## 2021-01-18 ENCOUNTER — Encounter (HOSPITAL_COMMUNITY): Payer: Self-pay | Admitting: Obstetrics and Gynecology

## 2021-01-18 ENCOUNTER — Other Ambulatory Visit: Payer: Self-pay

## 2021-01-18 ENCOUNTER — Inpatient Hospital Stay (HOSPITAL_COMMUNITY)
Admission: AD | Admit: 2021-01-18 | Discharge: 2021-01-21 | DRG: 806 | Disposition: A | Discharge status: Home / Self Care | Payer: BLUE CROSS/BLUE SHIELD | Attending: Obstetrics and Gynecology | Admitting: Obstetrics and Gynecology

## 2021-01-18 DIAGNOSIS — O42913 Preterm premature rupture of membranes, unspecified as to length of time between rupture and onset of labor, third trimester: Secondary | ICD-10-CM | POA: Diagnosis present

## 2021-01-18 DIAGNOSIS — D62 Acute posthemorrhagic anemia: Secondary | ICD-10-CM | POA: Diagnosis not present

## 2021-01-18 DIAGNOSIS — O9081 Anemia of the puerperium: Secondary | ICD-10-CM | POA: Diagnosis not present

## 2021-01-18 DIAGNOSIS — O42919 Preterm premature rupture of membranes, unspecified as to length of time between rupture and onset of labor, unspecified trimester: Secondary | ICD-10-CM | POA: Diagnosis present

## 2021-01-18 DIAGNOSIS — O365930 Maternal care for other known or suspected poor fetal growth, third trimester, not applicable or unspecified: Secondary | ICD-10-CM | POA: Diagnosis present

## 2021-01-18 DIAGNOSIS — O99824 Streptococcus B carrier state complicating childbirth: Secondary | ICD-10-CM | POA: Diagnosis present

## 2021-01-18 DIAGNOSIS — Z20822 Contact with and (suspected) exposure to covid-19: Secondary | ICD-10-CM | POA: Diagnosis present

## 2021-01-18 DIAGNOSIS — Z3A36 36 weeks gestation of pregnancy: Secondary | ICD-10-CM

## 2021-01-18 LAB — CBC
HCT: 33.8 % — ABNORMAL LOW (ref 36.0–46.0)
Hemoglobin: 10.5 g/dL — ABNORMAL LOW (ref 12.0–15.0)
MCH: 27.1 pg (ref 26.0–34.0)
MCHC: 31.1 g/dL (ref 30.0–36.0)
MCV: 87.1 fL (ref 80.0–100.0)
Platelets: 250 10*3/uL (ref 150–400)
RBC: 3.88 MIL/uL (ref 3.87–5.11)
RDW: 12.8 % (ref 11.5–15.5)
WBC: 10.6 10*3/uL — ABNORMAL HIGH (ref 4.0–10.5)
nRBC: 0 % (ref 0.0–0.2)

## 2021-01-18 LAB — URINALYSIS, ROUTINE W REFLEX MICROSCOPIC
Bilirubin Urine: NEGATIVE
Glucose, UA: NEGATIVE mg/dL
Ketones, ur: NEGATIVE mg/dL
Nitrite: NEGATIVE
Protein, ur: 30 mg/dL — AB
Specific Gravity, Urine: 1.016 (ref 1.005–1.030)
Squamous Epithelial / HPF: >50 — ABNORMAL HIGH (ref 0–5)
WBC, UA: >50 WBC/hpf — ABNORMAL HIGH (ref 0–5)
pH: 5 (ref 5.0–8.0)

## 2021-01-18 LAB — RESP PANEL BY RT-PCR (FLU A&B, COVID) ARPGX2
Influenza A by PCR: NEGATIVE
Influenza B by PCR: NEGATIVE
SARS Coronavirus 2 by RT PCR: NEGATIVE

## 2021-01-18 LAB — TYPE AND SCREEN
ABO/RH(D): O POS
Antibody Screen: NEGATIVE

## 2021-01-18 LAB — POCT FERN TEST: POCT Fern Test: POSITIVE

## 2021-01-18 MED ORDER — SODIUM CHLORIDE 0.9 % IV SOLN: 5.0000 10*6.[IU] | Freq: Once | INTRAVENOUS | Status: DC

## 2021-01-18 MED ORDER — PENICILLIN G POT IN DEXTROSE 60000 UNIT/ML IV SOLN
3.0000 10*6.[IU] | INTRAVENOUS | Status: DC
  Administered 2021-01-19 (×4): 3 10*6.[IU] via INTRAVENOUS
  Filled 2021-01-18 (×8): qty 50

## 2021-01-18 MED ORDER — SODIUM CHLORIDE 0.9 % IV SOLN
5.0000 10*6.[IU] | Freq: Once | INTRAVENOUS | Status: AC
  Administered 2021-01-18: 5 10*6.[IU] via INTRAVENOUS
  Filled 2021-01-18: qty 5

## 2021-01-18 MED ORDER — PENICILLIN G POT IN DEXTROSE 60000 UNIT/ML IV SOLN
3.0000 10*6.[IU] | INTRAVENOUS | Status: DC
Start: 2021-01-18 — End: 2021-01-18

## 2021-01-18 MED ORDER — LACTATED RINGERS IV SOLN: INTRAVENOUS | Status: DC

## 2021-01-18 NOTE — MAU Note (Signed)
Presents c/o LOF since 1500 this afternoon, reports fluid is clear.  Denies VB.  Endorses +FM.  Reports light pressure in lower abdomen, denies pain.

## 2021-01-18 NOTE — H&P (Signed)
Tina Larsen is a 35 y.o. female G3P0101 at 36 weeks and 1 day presents complaining of SROM at 3 pm clear fluid. Positive Fern and pooling in MAU. She denies contractions. +FM no vaginal bleeding. Pregnancy complicated by IUGR followed by MFM .Marland Kitchen Last 01/10/2021  EFW 4 lbs 6 oz at 3.4 %ile.  H/O preterm delivery at 34 weeks.( Pt declined 17P)  Prenatal care provided by Dr. Steva Ready with [facility]. . OB History     Gravida  3   Para  1   Term      Preterm  1   AB  1   Living  1      SAB  1   IAB      Ectopic      Multiple  0   Live Births             Past Medical History:  Diagnosis Date   Headache    Migraine    History reviewed. No pertinent surgical history. Family History: family history is not on file. Social History:  reports that she has never smoked. She has never used smokeless tobacco. She reports that she does not drink alcohol and does not use drugs.     Maternal Diabetes: No Genetic Screening: Normal Maternal Ultrasounds/Referrals: IUGR Fetal Ultrasounds or other Referrals:  None Maternal Substance Abuse:  No Significant Maternal Medications:  None Significant Maternal Lab Results:  Group B Strep positive Other Comments:   None  Review of Systems  Constitutional: Negative.   HENT: Negative.    Eyes: Negative.   Respiratory: Negative.    Cardiovascular: Negative.   Gastrointestinal: Negative.   Endocrine: Negative.   Genitourinary: Negative.   Musculoskeletal: Negative.   Skin: Negative.   Allergic/Immunologic: Negative.   Neurological: Negative.   Hematological: Negative.   Psychiatric/Behavioral: Negative.    History Dilation: 2 Effacement (%): 70, 80 Station: -2 Exam by:: Dr. Richardson Dopp Blood pressure 133/85, pulse 99, temperature 98.5 F (36.9 C), temperature source Oral, resp. rate 19, height 5\' 2"  (1.575 m), weight 95 kg, last menstrual period 05/10/2020, SpO2 100 %, unknown if currently breastfeeding. Maternal Exam:   Introitus: Normal vulva.  Physical Exam Vitals reviewed.  Constitutional:      Appearance: Normal appearance.  HENT:     Head: Normocephalic and atraumatic.     Nose: Nose normal.     Mouth/Throat:     Mouth: Mucous membranes are moist.  Eyes:     Pupils: Pupils are equal, round, and reactive to light.  Cardiovascular:     Rate and Rhythm: Normal rate and regular rhythm.  Pulmonary:     Effort: Pulmonary effort is normal.     Breath sounds: Normal breath sounds.  Abdominal:     Tenderness: There is no abdominal tenderness.  Genitourinary:    General: Normal vulva.     Comments: Cervix 2/75/-2 Vertex  Musculoskeletal:        General: Normal range of motion.     Cervical back: Normal range of motion and neck supple.  Skin:    General: Skin is warm and dry.  Neurological:     General: No focal deficit present.     Mental Status: She is alert and oriented to person, place, and time.  Psychiatric:        Mood and Affect: Mood normal.        Behavior: Behavior normal.    Prenatal labs: ABO, Rh: O positive  Antibody: Negative  Rubella:  Immune  RPR:   REactive titer 1:2 previous titer 1:2 pt had h/o syphyllis 10-15 years ago  HBsAg:   Negative  HIV:   Negative  GBS:   Positive 01/05/2021  Assessment/Plan: 36 weeks and 1 day IUGR/  PPROM/ Favorable Cervix  - Admit to Labor and delivery  - Pitocin for augmentation  -GBS positive- Penicillin IV  - Category 1 tracing  Anticipate SVD  CCOB covering after 7 pm today. Dr. Connye Burkitt to assume care at 7am on 01/19/2021   Gerald Leitz 01/18/2021, 6:57 PM

## 2021-01-18 NOTE — MAU Provider Note (Signed)
History     [id number]  Arrival date and time: 01/18/21 1614    Chief Complaint  Patient presents with   Rupture of Membranes     HPI Tina Larsen is a 35 y.o. at [redacted]w[redacted]d, who presents for possible rupture of membranes.   Patient reports leakage of fluid around 1500, clear No contractions No bleeding Normal fetal movement     OB History     Gravida  3   Para  1   Term      Preterm  1   AB  1   Living  1      SAB  1   IAB      Ectopic      Multiple  0   Live Births              Past Medical History:  Diagnosis Date   Headache    Migraine     History reviewed. No pertinent surgical history.  Family History  Problem Relation Age of Onset   Cancer Neg Hx    Diabetes Neg Hx    Hearing loss Neg Hx    Hypertension Neg Hx     Social History   Socioeconomic History   Marital status: Single    Spouse name: Not on file   Number of children: 1   Years of education: Not on file   Highest education level: Not on file  Occupational History   Not on file  Tobacco Use   Smoking status: Never   Smokeless tobacco: Never  Vaping Use   Vaping Use: Never used  Substance and Sexual Activity   Alcohol use: No   Drug use: No   Sexual activity: Yes    Birth control/protection: None  Other Topics Concern   Not on file  Social History Narrative   Not on file   Social Determinants of Health   Financial Resource Strain: Not on file  Food Insecurity: Not on file  Transportation Needs: Not on file  Physical Activity: Not on file  Stress: Not on file  Social Connections: Not on file  Intimate Partner Violence: Not on file    No Known Allergies  No current facility-administered medications on file prior to encounter.   Current Outpatient Medications on File Prior to Encounter  Medication Sig Dispense Refill   meloxicam (MOBIC) 15 MG tablet Take 1 tablet (15 mg total) by mouth daily. (Patient not taking: No sig reported) 30 tablet 1    Prenat-FeCbn-FeAsp-Meth-FA-DHA (PRENATE MINI) 18-0.6-0.4-350 MG CAPS Take 1 capsule by mouth daily. 30 capsule 12     ROS Pertinent positives and negative per HPI, all others reviewed and negative  Physical Exam   BP 133/85 (BP Location: Right Arm)   Pulse 99   Temp 98.5 F (36.9 C) (Oral)   Resp 19   Ht 5\' 2"  (1.575 m)   Wt 95 kg   LMP 05/10/2020   SpO2 100%   BMI 38.32 kg/m   Patient Vitals for the past 24 hrs:  BP Temp Temp src Pulse Resp SpO2 Height Weight  01/18/21 1639 133/85 98.5 F (36.9 C) Oral 99 19 100 % -- --  01/18/21 1634 -- -- -- -- -- -- 5\' 2"  (1.575 m) 95 kg    Physical Exam Vitals reviewed.  Constitutional:      General: She is not in acute distress.    Appearance: She is well-developed. She is not diaphoretic.  Eyes:  General: No scleral icterus. Pulmonary:     Effort: Pulmonary effort is normal. No respiratory distress.  Abdominal:     General: There is no distension.     Palpations: Abdomen is soft.     Tenderness: There is no abdominal tenderness. There is no guarding or rebound.  Genitourinary:    Comments: SSE with positive pool Skin:    General: Skin is warm and dry.  Neurological:     Mental Status: She is alert.     Coordination: Coordination normal.     Cervical Exam    Bedside Ultrasound Pt informed that the ultrasound is considered a limited OB ultrasound and is not intended to be a complete ultrasound exam.  Patient also informed that the ultrasound is not being completed with the intent of assessing for fetal or placental anomalies or any pelvic abnormalities.  Explained that the purpose of today's ultrasound is to assess for  presentation.  Patient acknowledges the purpose of the exam and the limitations of the study.    My interpretation: cephalic  FHT Baseline 135, moderate variability, numerous accels, no decels Toco: quiet Cat: I  Labs Results for orders placed or performed during the hospital encounter of  01/18/21 (from the past 24 hour(s))  Urinalysis, Routine w reflex microscopic Urine, Clean Catch     Status: Abnormal   Collection Time: 01/18/21  5:04 PM  Result Value Ref Range   Color, Urine AMBER (A) YELLOW   APPearance CLOUDY (A) CLEAR   Specific Gravity, Urine 1.016 1.005 - 1.030   pH 5.0 5.0 - 8.0   Glucose, UA NEGATIVE NEGATIVE mg/dL   Hgb urine dipstick SMALL (A) NEGATIVE   Bilirubin Urine NEGATIVE NEGATIVE   Ketones, ur NEGATIVE NEGATIVE mg/dL   Protein, ur 30 (A) NEGATIVE mg/dL   Nitrite NEGATIVE NEGATIVE   Leukocytes,Ua MODERATE (A) NEGATIVE   RBC / HPF 21-50 0 - 5 RBC/hpf   WBC, UA >50 (H) 0 - 5 WBC/hpf   Bacteria, UA MANY (A) NONE SEEN   Squamous Epithelial / LPF >50 (H) 0 - 5   Mucus PRESENT   Fern Test     Status: None   Collection Time: 01/18/21  6:01 PM  Result Value Ref Range   POCT Fern Test Positive = ruptured amniotic membanes     Imaging No results found.  MAU Course  Procedures  Lab Orders         Resp Panel by RT-PCR (Flu A&B, Covid) Nasopharyngeal Swab         Urinalysis, Routine w reflex microscopic Urine, Clean Catch         CBC         RPR         Fern Test    Meds ordered this encounter  Medications   lactated ringers infusion   DISCONTD: penicillin G potassium 5 Million Units in sodium chloride 0.9 % 250 mL IVPB    Order Specific Question:   Antibiotic Indication:    Answer:   Group B Strep Prophylaxis   DISCONTD: penicillin G potassium 3 Million Units in dextrose 15mL IVPB    Order Specific Question:   Antibiotic Indication:    Answer:   Group B Strep Prophylaxis   penicillin G potassium 5 Million Units in sodium chloride 0.9 % 250 mL IVPB    Order Specific Question:   Antibiotic Indication:    Answer:   Group B Strep Prophylaxis   penicillin G potassium 3 Million Units  in dextrose 64mL IVPB    Order Specific Question:   Antibiotic Indication:    Answer:   Group B Strep Prophylaxis   Imaging Orders  No imaging studies ordered  today    MDM mild  Assessment and Plan  #SROM Positive pool and positive fern. Recommend admission for IOL, care transferred to private physician.  #FWB FHT Cat I NST: Reactive    Venora Maples, MD/MPH 01/18/21 6:41 PM

## 2021-01-19 ENCOUNTER — Encounter (HOSPITAL_COMMUNITY): Payer: Self-pay | Admitting: Obstetrics and Gynecology

## 2021-01-19 ENCOUNTER — Inpatient Hospital Stay (HOSPITAL_COMMUNITY): Payer: BLUE CROSS/BLUE SHIELD | Admitting: Anesthesiology

## 2021-01-19 DIAGNOSIS — O42919 Preterm premature rupture of membranes, unspecified as to length of time between rupture and onset of labor, unspecified trimester: Secondary | ICD-10-CM | POA: Diagnosis present

## 2021-01-19 LAB — RPR
RPR Ser Ql: REACTIVE — AB
RPR Titer: 1:2 {titer}

## 2021-01-19 MED ORDER — LACTATED RINGERS IV SOLN: 500.0000 mL | INTRAVENOUS | Status: DC | PRN

## 2021-01-19 MED ORDER — EPHEDRINE 5 MG/ML INJ: 10.0000 mg | INTRAVENOUS | Status: DC | PRN

## 2021-01-19 MED ORDER — ZOLPIDEM TARTRATE 5 MG PO TABS: 5.0000 mg | ORAL_TABLET | Freq: Every evening | ORAL | Status: DC | PRN

## 2021-01-19 MED ORDER — SENNOSIDES-DOCUSATE SODIUM 8.6-50 MG PO TABS
2.0000 | ORAL_TABLET | Freq: Every day | ORAL | Status: DC
  Filled 2021-01-19: qty 2

## 2021-01-19 MED ORDER — BENZOCAINE-MENTHOL 20-0.5 % EX AERO: 1.0000 "application " | INHALATION_SPRAY | CUTANEOUS | Status: DC | PRN

## 2021-01-19 MED ORDER — DIBUCAINE (PERIANAL) 1 % EX OINT: 1.0000 "application " | TOPICAL_OINTMENT | CUTANEOUS | Status: DC | PRN

## 2021-01-19 MED ORDER — OXYTOCIN BOLUS FROM INFUSION
333.0000 mL | Freq: Once | INTRAVENOUS | Status: AC
  Administered 2021-01-19: 333 mL via INTRAVENOUS

## 2021-01-19 MED ORDER — ONDANSETRON HCL 4 MG/2ML IJ SOLN: 4.0000 mg | INTRAMUSCULAR | Status: DC | PRN

## 2021-01-19 MED ORDER — LIDOCAINE HCL (PF) 1 % IJ SOLN: 30.0000 mL | INTRAMUSCULAR | Status: DC | PRN

## 2021-01-19 MED ORDER — COCONUT OIL OIL: 1.0000 "application " | TOPICAL_OIL | Status: DC | PRN

## 2021-01-19 MED ORDER — PHENYLEPHRINE 40 MCG/ML (10ML) SYRINGE FOR IV PUSH (FOR BLOOD PRESSURE SUPPORT): 80.0000 ug | PREFILLED_SYRINGE | INTRAVENOUS | Status: DC | PRN

## 2021-01-19 MED ORDER — TERBUTALINE SULFATE 1 MG/ML IJ SOLN: 0.2500 mg | Freq: Once | INTRAMUSCULAR | Status: DC | PRN

## 2021-01-19 MED ORDER — PRENATAL MULTIVITAMIN CH
1.0000 | ORAL_TABLET | Freq: Every day | ORAL | Status: DC
  Administered 2021-01-20 – 2021-01-21 (×2): 1 via ORAL
  Filled 2021-01-19 (×2): qty 1

## 2021-01-19 MED ORDER — LACTATED RINGERS IV SOLN
500.0000 mL | Freq: Once | INTRAVENOUS | Status: AC
  Administered 2021-01-19: 500 mL via INTRAVENOUS

## 2021-01-19 MED ORDER — WITCH HAZEL-GLYCERIN EX PADS: 1.0000 "application " | MEDICATED_PAD | CUTANEOUS | Status: DC | PRN

## 2021-01-19 MED ORDER — OXYCODONE-ACETAMINOPHEN 5-325 MG PO TABS: 2.0000 | ORAL_TABLET | ORAL | Status: DC | PRN

## 2021-01-19 MED ORDER — SIMETHICONE 80 MG PO CHEW: 80.0000 mg | CHEWABLE_TABLET | ORAL | Status: DC | PRN

## 2021-01-19 MED ORDER — ACETAMINOPHEN 325 MG PO TABS: 650.0000 mg | ORAL_TABLET | ORAL | Status: DC | PRN

## 2021-01-19 MED ORDER — OXYTOCIN-SODIUM CHLORIDE 30-0.9 UT/500ML-% IV SOLN
2.5000 [IU]/h | INTRAVENOUS | Status: DC
  Administered 2021-01-19: 2.5 [IU]/h via INTRAVENOUS
  Filled 2021-01-19: qty 500

## 2021-01-19 MED ORDER — FENTANYL-BUPIVACAINE-NACL 0.5-0.125-0.9 MG/250ML-% EP SOLN
12.0000 mL/h | EPIDURAL | Status: DC | PRN
  Administered 2021-01-19: 12 mL/h via EPIDURAL
  Filled 2021-01-19: qty 250

## 2021-01-19 MED ORDER — TETANUS-DIPHTH-ACELL PERTUSSIS 5-2.5-18.5 LF-MCG/0.5 IM SUSY: 0.5000 mL | PREFILLED_SYRINGE | Freq: Once | INTRAMUSCULAR | Status: DC

## 2021-01-19 MED ORDER — FLEET ENEMA 7-19 GM/118ML RE ENEM: 1.0000 | ENEMA | RECTAL | Status: DC | PRN

## 2021-01-19 MED ORDER — FENTANYL CITRATE (PF) 100 MCG/2ML IJ SOLN
50.0000 ug | INTRAMUSCULAR | Status: DC | PRN
  Administered 2021-01-19: 50 ug via INTRAVENOUS
  Filled 2021-01-19: qty 2

## 2021-01-19 MED ORDER — LIDOCAINE HCL (PF) 1 % IJ SOLN
INTRAMUSCULAR | Status: DC | PRN
  Administered 2021-01-19: 4 mL via EPIDURAL
  Administered 2021-01-19: 5 mL via EPIDURAL

## 2021-01-19 MED ORDER — ACETAMINOPHEN 325 MG PO TABS
650.0000 mg | ORAL_TABLET | ORAL | Status: DC | PRN
  Administered 2021-01-19: 650 mg via ORAL
  Filled 2021-01-19: qty 2

## 2021-01-19 MED ORDER — OXYTOCIN-SODIUM CHLORIDE 30-0.9 UT/500ML-% IV SOLN
1.0000 m[IU]/min | INTRAVENOUS | Status: DC
  Administered 2021-01-19: 2 m[IU]/min via INTRAVENOUS
  Filled 2021-01-19: qty 500

## 2021-01-19 MED ORDER — IBUPROFEN 600 MG PO TABS
600.0000 mg | ORAL_TABLET | Freq: Four times a day (QID) | ORAL | Status: DC
  Administered 2021-01-19 – 2021-01-21 (×6): 600 mg via ORAL
  Filled 2021-01-19 (×7): qty 1

## 2021-01-19 MED ORDER — SOD CITRATE-CITRIC ACID 500-334 MG/5ML PO SOLN: 30.0000 mL | ORAL | Status: DC | PRN

## 2021-01-19 MED ORDER — OXYCODONE-ACETAMINOPHEN 5-325 MG PO TABS: 1.0000 | ORAL_TABLET | ORAL | Status: DC | PRN

## 2021-01-19 MED ORDER — DIPHENHYDRAMINE HCL 25 MG PO CAPS: 25.0000 mg | ORAL_CAPSULE | Freq: Four times a day (QID) | ORAL | Status: DC | PRN

## 2021-01-19 MED ORDER — ONDANSETRON HCL 4 MG PO TABS: 4.0000 mg | ORAL_TABLET | ORAL | Status: DC | PRN

## 2021-01-19 MED ORDER — DIPHENHYDRAMINE HCL 50 MG/ML IJ SOLN: 12.5000 mg | INTRAMUSCULAR | Status: DC | PRN

## 2021-01-19 MED ORDER — ONDANSETRON HCL 4 MG/2ML IJ SOLN
4.0000 mg | Freq: Four times a day (QID) | INTRAMUSCULAR | Status: DC | PRN
  Administered 2021-01-19 (×2): 4 mg via INTRAVENOUS
  Filled 2021-01-19 (×2): qty 2

## 2021-01-19 NOTE — Progress Notes (Signed)
OB Progress Note  S: Patient resting comfortably. She is not feeling any contractions.  O: BP (!) 99/57   Pulse 89   Temp 97.7 F (36.5 C) (Oral)   Resp 16   Ht 5\' 2"  (1.575 m)   Wt 95 kg   LMP 05/10/2020   SpO2 100%   BMI 38.32 kg/m   FHT: 135bpm, moderate variablity, + accels, - decels Toco: irregular SVE: deferred, exam 4/50/high at 0505 by primary RN  A/P: 35 y.o. 30 @ [redacted]w[redacted]d admitted for induction of labor for PPROM.  FWB: Cat. I Labor course: Pitocin at 81mU/min Pain: Per patient request GBS: Positive - receiving PCN Anticipate SVD  We discussed risks/benefits of Betamethasone administration for late term. Patient and her significant other declines at this time.  11m, DO

## 2021-01-19 NOTE — Progress Notes (Signed)
OB Progress Note  S: Patient resting. Reports contraction pain as a 6/10. Desires pain medication.  O: BP 115/71   Pulse 68   Temp 97.9 F (36.6 C) (Oral)   Resp 16   Ht 5\' 2"  (1.575 m)   Wt 95 kg   LMP 05/10/2020   SpO2 100%   BMI 38.32 kg/m   FHT: 125bpm, moderate variablity, + accels, - decels Toco: q1-2 minutes SVE: 4-5/80/-3, sutures palpated  A/P: 35 y.o. 30 @ [redacted]w[redacted]d admitted for induction of labor for PPROM.  FWB: Cat. I Labor course: Original rupture of membranes time: 1500 on 8/25, Pitocin at 37mU/min Pain: Per patient request GBS: Positive - receiving PCN Anticipate SVD  30m, DO

## 2021-01-19 NOTE — Anesthesia Procedure Notes (Signed)
Epidural Patient location during procedure: OB Start time: 01/19/2021 3:36 PM End time: 01/19/2021 3:39 PM  Staffing Anesthesiologist: Beryle Lathe, MD Performed: anesthesiologist   Preanesthetic Checklist Completed: patient identified, IV checked, risks and benefits discussed, monitors and equipment checked, pre-op evaluation and timeout performed  Epidural Patient position: sitting Prep: DuraPrep Patient monitoring: continuous pulse ox and blood pressure Approach: midline Location: L2-L3 Injection technique: LOR saline  Needle:  Needle type: Tuohy  Needle gauge: 17 G Needle length: 9 cm Needle insertion depth: 4 cm Catheter size: 19 Gauge Catheter at skin depth: 9 cm Test dose: negative and Other (1% lidocaine)  Assessment Events: blood not aspirated  Additional Notes Patient identified. Risks including, but not limited to, bleeding, infection, nerve damage, paralysis, inadequate analgesia, blood pressure changes, nausea, vomiting, allergic reaction, postpartum back pain, itching, and headache were discussed. Patient expressed understanding and wished to proceed. Sterile prep and drape, including hand hygiene, mask, and sterile gloves were used. The patient was positioned and the spine was prepped. The skin was anesthetized with lidocaine. No paraesthesia or other complication noted. The patient did not experience any signs of intravascular injection such as tinnitus or metallic taste in mouth, nor signs of intrathecal spread such as rapid motor block. Please see nursing notes for vital signs. The patient tolerated the procedure well.   Leslye Peer, MDReason for block:procedure for pain

## 2021-01-19 NOTE — Progress Notes (Signed)
Labor Progress Note   Tina Larsen is a 35 y.o. female G3P0101 on 8/25, at 36 weeks and 1 day presents complaining of SROM at 3 pm clear fluid. Positive Fern and pooling in MAU. Pt currently [redacted]w[redacted]d. Pregnancy complicated by IUGR followed by MFM. Last 01/10/2021  EFW 4 lbs 6 oz at 3.4 %ile.  H/O preterm delivery at 34 weeks.( Pt declined 17P).  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 Prenatal care provided by Dr. Steva Ready with [facility]  Subjective: Pt lying in bed comfortable post epidural, progressing on pitocin. Discussed IUPC placement d/t 0.5cm change over last 12 hour and on pitocin at 28, currently reviewed R/B/A, pt verbalized consent.  Patient Active Problem List   Diagnosis Date Noted   Normal labor and delivery 01/18/2021   Encounter to determine fetal viability of pregnancy 06/23/2018   [redacted] weeks gestation of pregnancy 06/23/2018   Pregnancy test positive 06/23/2018   Objective: BP 105/66   Pulse (!) 59   Temp 97.6 F (36.4 C) (Oral)   Resp 16   Ht 5\' 2"  (1.575 m)   Wt 95 kg   LMP 05/10/2020   SpO2 100%   BMI 38.32 kg/m  I/O last 3 completed shifts: In: -  Out: 150 [Urine:150] No intake/output data recorded. NST: FHR baseline 125 bpm, Variability: moderate, Accelerations:present, Decelerations:  Absent= Cat 1/Reactive CTX:  regular, every 1-3 minutes Uterus gravid, soft non tender, moderate to palpate with contractions.  SVE:  Dilation: 5 Effacement (%): 90 Station: 0 Exam by:: [facility] CNM Pitocin at 71mUn/min IUPC placed with ease.  MVU 200s  Assessment:  Tina Larsen is a 35 y.o. female G3P0101 on 8/25, at 36 weeks and 1 day presents complaining of SROM at 3 pm clear fluid. Positive Fern and pooling in MAU. Pt currently [redacted]w[redacted]d. Pregnancy complicated by IUGR followed by MFM. Last 01/10/2021  EFW 4 lbs 6 oz at 3.4 %ile.  H/O preterm delivery at 34 weeks.( Pt declined 17P). 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 15 years ago.  Prenatal  care provided by Dr. 01/12/2021 with [facility]. Progressing in latent labor with pitocin, comfortable with epidural.  Patient Active Problem List   Diagnosis Date Noted   Normal labor and delivery 01/18/2021   Encounter to determine fetal viability of pregnancy 06/23/2018   [redacted] weeks gestation of pregnancy 06/23/2018   Pregnancy test positive 06/23/2018   NICHD: Category 1  Membranes:  PPROM @ 1500 8/25 x 28hrs, no s/s of infection  MVUs 200  Induction:    Cytotec xN/A  Foley Bulb: inserted  N/A  Pitocin - 28  Pain management:               IV pain management: x fentanyl @ 1247 on 8/26  Nitrous: Declined             Epidural placement:  at 8/26 on 1553  GBS Positive  Abx: PCN @ 1913 on 8/25, on 8/26 0422, 0832, 1335, 1855  +RPR titers: + 15 yrs ago tx, titers stable at 1:2, T.pall pending.    Plan: Continue labor plan Continuous monitoring Rest Frequent position changes to facilitate fetal rotation and descent. Will reassess with cervical exam if necessary, avoid repetitive check to decrease infection transmission.  If tachysytole occurs will decrease pitocin to 20. If maternal fever, maternal tachycardia and fetal tachycardia occur will start Unasyn 3gm Q6H Hold pitocin at MVU of 200  Anticipate labor progression and  vaginal delivery.   Md Dillard aware of plan and verbalized agreement.   Dale Pender, NP-C, CNM, MSN 01/19/2021. 7:57 PM

## 2021-01-19 NOTE — Progress Notes (Signed)
Subjective:    Pt has been transferred to L&D. Continues to leak clear fluid. SVE deferred to reduce risk of infection R/T ROM. Plans epidural for pain management. Perceives contractions as mild since starting Pitocin. Pt states she would like PP permanent sterilization. CNM to discuss pt's request w/ Dr. Connye Burkitt.   Objective:    VS: BP 114/73   Pulse 72   Temp 97.8 F (36.6 C) (Oral)   Resp 16   Ht 5\' 2"  (1.575 m)   Wt 95 kg   LMP 05/10/2020   SpO2 100%   BMI 38.32 kg/m  FHR : baseline 135 / variability moderate / accelerations present / absent decelerations Toco: contractions every irreg minutes  Membranes: SROM x15 hrs, remains clear Dilation: 4 Effacement (%): 50 Station: Ballotable Presentation: Vertex Exam by:: Jen Mbugwa Pitocin 4 mU/min  Assessment/Plan:   35 y.o. 30 [redacted]w[redacted]d  Labor:  Progressing on Pitocin Fetal Wellbeing:  Category I Pain Control:   planning epidural I/D:   GBS neg, PCN prophylaxis x3 doses Anticipated MOD:  NSVD  [redacted]w[redacted]d MSN, CNM 01/19/2021 6:46 AM

## 2021-01-19 NOTE — Anesthesia Preprocedure Evaluation (Signed)
Anesthesia Evaluation  Patient identified by MRN, date of birth, ID band Patient awake    Reviewed: Allergy & Precautions, NPO status , Patient's Chart, lab work & pertinent test results  History of Anesthesia Complications Negative for: history of anesthetic complications  Airway Mallampati: II   Neck ROM: Full    Dental   Pulmonary neg pulmonary ROS,    Pulmonary exam normal        Cardiovascular negative cardio ROS Normal cardiovascular exam     Neuro/Psych  Headaches, negative psych ROS   GI/Hepatic negative GI ROS, Neg liver ROS,   Endo/Other   Obesity   Renal/GU negative Renal ROS     Musculoskeletal negative musculoskeletal ROS (+)   Abdominal   Peds  Hematology negative hematology ROS (+)   Anesthesia Other Findings   Reproductive/Obstetrics (+) Pregnancy                             Anesthesia Physical Anesthesia Plan  ASA: 2  Anesthesia Plan: Epidural   Post-op Pain Management:    Induction:   PONV Risk Score and Plan: 2 and Treatment may vary due to age or medical condition  Airway Management Planned: Natural Airway  Additional Equipment: None  Intra-op Plan:   Post-operative Plan:   Informed Consent: I have reviewed the patients History and Physical, chart, labs and discussed the procedure including the risks, benefits and alternatives for the proposed anesthesia with the patient or authorized representative who has indicated his/her understanding and acceptance.       Plan Discussed with: Anesthesiologist  Anesthesia Plan Comments: (Labs reviewed. Platelets acceptable, patient not taking any blood thinning medications. Per RN, FHR tracing reported to be stable enough for sitting procedure. Risks and benefits discussed with patient, including PDPH, backache, epidural hematoma, failed epidural, blood pressure changes, allergic reaction, and nerve injury.  Patient expressed understanding and wished to proceed.)        Anesthesia Quick Evaluation

## 2021-01-20 ENCOUNTER — Encounter (HOSPITAL_COMMUNITY): Payer: Self-pay | Admitting: Obstetrics and Gynecology

## 2021-01-20 DIAGNOSIS — D62 Acute posthemorrhagic anemia: Secondary | ICD-10-CM

## 2021-01-20 HISTORY — DX: Acute posthemorrhagic anemia: D62

## 2021-01-20 LAB — CBC
HCT: 30.7 % — ABNORMAL LOW (ref 36.0–46.0)
Hemoglobin: 9.5 g/dL — ABNORMAL LOW (ref 12.0–15.0)
MCH: 26.9 pg (ref 26.0–34.0)
MCHC: 30.9 g/dL (ref 30.0–36.0)
MCV: 87 fL (ref 80.0–100.0)
Platelets: 225 10*3/uL (ref 150–400)
RBC: 3.53 MIL/uL — ABNORMAL LOW (ref 3.87–5.11)
RDW: 12.8 % (ref 11.5–15.5)
WBC: 15.6 10*3/uL — ABNORMAL HIGH (ref 4.0–10.5)
nRBC: 0 % (ref 0.0–0.2)

## 2021-01-20 MED ORDER — POLYSACCHARIDE IRON COMPLEX 150 MG PO CAPS
150.0000 mg | ORAL_CAPSULE | Freq: Every day | ORAL | Status: DC
  Administered 2021-01-20 – 2021-01-21 (×2): 150 mg via ORAL
  Filled 2021-01-20 (×2): qty 1

## 2021-01-20 NOTE — Progress Notes (Signed)
Subjective: Postpartum Day # 1 : [facility] pt of DR Earlene Plater, S/P NSVD due to pt was admitted on 8/25 for PPROM at 36.2, IUGR 3.4%, H/O syphilis, tx 15 years ago, stable titers at 1:2, pending T/Pall, GBS +, adequately tx, progressed with pitocin had SVD on 8/26 @ 2126 over intact perineum, ebl , hgb drop of 10.5-9.5, asymptomatic, on PO iron. Pt sleeping for assessment. Discussed POC with husband. Patient up ad lib, denies syncope or dizziness. Reports consuming regular diet without issues and denies N/V. Patient reports 0 bowel movement + passing flatus.  Denies issues with urination and reports bleeding is "lighter."  Patient is bottle feeding and reports going well.  Desires PP tubal around 4-6 weeks PP for postpartum contraception.  Pain is being appropriately managed with use of po meds.   No laceration Feeding:  Bottle Contraceptive plan:  PP tubal  Baby female.   Objective: Vital signs in last 24 hours: Patient Vitals for the past 24 hrs:  BP Temp Temp src Pulse Resp SpO2  01/20/21 0837 98/77 97.8 F (36.6 C) Oral (!) 58 18 99 %  01/20/21 0528 111/66 -- -- (!) 53 18 100 %  01/20/21 0115 113/61 97.7 F (36.5 C) Axillary (!) 52 18 99 %  01/20/21 0000 119/74 97.8 F (36.6 C) Oral (!) 58 19 100 %  01/19/21 2231 103/70 -- -- 100 -- --  01/19/21 2215 116/73 -- -- 60 -- --  01/19/21 2201 (!) 100/53 -- -- 82 16 --  01/19/21 2145 114/69 -- -- 99 15 100 %  01/19/21 2101 120/83 -- -- 66 -- --  01/19/21 2031 99/63 -- -- 71 -- --  01/19/21 2028 -- -- -- -- -- 100 %  01/19/21 2001 106/75 -- -- 79 -- --  01/19/21 1931 115/72 97.7 F (36.5 C) Oral (!) 58 -- --  01/19/21 1901 105/69 -- -- (!) 57 -- --  01/19/21 1831 105/66 -- -- (!) 59 -- --  01/19/21 1801 110/70 -- -- (!) 54 -- --  01/19/21 1731 113/68 -- -- 67 -- --  01/19/21 1700 106/68 97.6 F (36.4 C) Oral (!) 50 16 --  01/19/21 1631 (!) 99/58 -- -- (!) 57 -- --  01/19/21 1611 114/64 -- -- (!) 55 -- --  01/19/21 1610 -- --  -- -- -- 100 %  01/19/21 1606 109/74 -- -- (!) 58 -- --  01/19/21 1605 -- -- -- -- -- 100 %  01/19/21 1601 109/71 -- -- (!) 58 -- --  01/19/21 1600 109/71 -- -- (!) 58 -- 100 %  01/19/21 1556 100/69 -- -- 63 -- --  01/19/21 1555 -- -- -- -- -- 100 %  01/19/21 1551 104/63 -- -- 63 -- --  01/19/21 1550 -- -- -- -- -- 100 %  01/19/21 1546 126/74 -- -- (!) 56 -- --  01/19/21 1540 123/87 -- -- 61 -- 100 %  01/19/21 1530 -- -- -- -- -- 99 %  01/19/21 1444 111/77 -- -- 68 -- --  01/19/21 1340 106/72 -- -- 65 -- --  01/19/21 1244 115/71 97.9 F (36.6 C) Oral 68 16 --  01/19/21 1157 115/82 -- -- 80 -- --  01/19/21 1031 116/85 97.9 F (36.6 C) Axillary 65 16 --  01/19/21 0948 (!) 130/101 -- -- 80 -- --     Physical Exam:  General: alert, cooperative, appears stated age, and no distress Mood/Affect: sleeping  Lungs: clear to auscultation, no wheezes, rales or  rhonchi, symmetric air entry.  Heart: normal rate, regular rhythm, normal S1, S2, no murmurs, rubs, clicks or gallops. Breast: breasts appear normal, no suspicious masses, no skin or nipple changes or axillary nodes. Abdomen:  + bowel sounds, soft, non-tender GU: perineum intact, healing well. No signs of external hematomas.  Uterine Fundus: firm Lochia: appropriate Skin: Warm, Dry. DVT Evaluation: No evidence of DVT seen on physical exam. Negative Homan's sign. No cords or calf tenderness. No significant calf/ankle edema.  CBC Latest Ref Rng & Units 01/20/2021 01/18/2021 01/20/2009  WBC 4.0 - 10.5 K/uL 15.6(H) 10.6(H) 27.8(H)  Hemoglobin 12.0 - 15.0 g/dL 7.5(T) 10.5(L) 11.2(L)  Hematocrit 36.0 - 46.0 % 30.7(L) 33.8(L) 34.4(L)  Platelets 150 - 400 K/uL 225 250 203    Results for orders placed or performed during the hospital encounter of 01/18/21 (from the past 24 hour(s))  CBC     Status: Abnormal   Collection Time: 01/20/21  1:39 AM  Result Value Ref Range   WBC 15.6 (H) 4.0 - 10.5 K/uL   RBC 3.53 (L) 3.87 - 5.11 MIL/uL    Hemoglobin 9.5 (L) 12.0 - 15.0 g/dL   HCT 70.0 (L) 17.4 - 94.4 %   MCV 87.0 80.0 - 100.0 fL   MCH 26.9 26.0 - 34.0 pg   MCHC 30.9 30.0 - 36.0 g/dL   RDW 96.7 59.1 - 63.8 %   Platelets 225 150 - 400 K/uL   nRBC 0.0 0.0 - 0.2 %     CBG (last 3)  No results for input(s): GLUCAP in the last 72 hours.   I/O last 3 completed shifts: In: -  Out: 675 [Urine:550; Blood:125]   Assessment Postpartum Day # 1 : [facility] pt of DR Earlene Plater, S/P NSVD due to pt was admitted on 8/25 for PPROM at 36.2, IUGR 3.4%, H/O syphilis, tx 15 years ago, stable titers at 1:2, pending T/Pall, GBS +, adequately tx, progressed with pitocin had SVD on 8/26 @ 2126 over intact perineum, ebl , hgb drop of 10.5-9.5, asymptomatic, on PO iron.Pt stable. -1 involution. Bottle feeding. Hemodynamically stable.   Plan: Continue other mgmt as ordered VTE prophylactics: Early ambulated as tolerates.  Pain control: Motrin/Tylenol PRN Plan for discharge tomorrow and Contraception Interval tubal PP  Dr. Normand Sloop to be updated on patient status  [facility] NP-C, CNM 01/20/2021, 8:53 AM

## 2021-01-20 NOTE — Anesthesia Postprocedure Evaluation (Signed)
Anesthesia Post Note  Patient: Tina Larsen  Procedure(s) Performed: AN AD HOC LABOR EPIDURAL     Patient location during evaluation: Mother Baby Anesthesia Type: Epidural Level of consciousness: awake, oriented and awake and alert Vital Signs Assessment: post-procedure vital signs reviewed and stable Respiratory status: spontaneous breathing, respiratory function stable and nonlabored ventilation Cardiovascular status: stable Postop Assessment: no headache, adequate PO intake, able to ambulate, patient able to bend at knees, no backache and no apparent nausea or vomiting Anesthetic complications: no   No notable events documented.  Last Vitals:  Vitals:   01/20/21 0528 01/20/21 0837  BP: 111/66 98/77  Pulse: (!) 53 (!) 58  Resp: 18 18  Temp:  36.6 C  SpO2: 100% 99%    Last Pain:  Vitals:   01/20/21 0837  TempSrc: Oral  PainSc: 0-No pain   Pain Goal:                Epidural/Spinal Function Cutaneous sensation: Normal sensation (01/20/21 0837), Patient able to flex knees: Yes (01/20/21 0837), Patient able to lift hips off bed: Yes (01/20/21 0837), Back pain beyond tenderness at insertion site: No (01/20/21 0837), Progressively worsening motor and/or sensory loss: No (01/20/21 0837), Bowel and/or bladder incontinence post epidural: No (01/20/21 0837)  Ernestine Rohman

## 2021-01-21 MED ORDER — ACETAMINOPHEN 325 MG PO TABS: 650.0000 mg | ORAL_TABLET | ORAL | Status: AC | PRN

## 2021-01-21 MED ORDER — POLYSACCHARIDE IRON COMPLEX 150 MG PO CAPS: 150.0000 mg | ORAL_CAPSULE | Freq: Every day | ORAL | 2 refills | Status: DC

## 2021-01-21 MED ORDER — IBUPROFEN 600 MG PO TABS
600.0000 mg | ORAL_TABLET | Freq: Four times a day (QID) | ORAL | 0 refills | Status: DC
Start: 2021-01-21 — End: 2021-05-23

## 2021-01-21 MED ORDER — OXYCODONE-ACETAMINOPHEN 5-325 MG PO TABS: 1.0000 | ORAL_TABLET | Freq: Three times a day (TID) | ORAL | 0 refills | Status: AC | PRN

## 2021-01-21 MED ORDER — OXYCODONE-ACETAMINOPHEN 5-325 MG PO TABS
1.0000 | ORAL_TABLET | Freq: Four times a day (QID) | ORAL | Status: DC | PRN
Start: 2021-01-21 — End: 2021-01-21
  Administered 2021-01-21: 1 via ORAL
  Filled 2021-01-21: qty 1

## 2021-01-21 NOTE — Discharge Summary (Signed)
Postpartum Discharge Summary  Date of Service updated 01/21/21    Patient Name: Tina Larsen DOB: 1985/11/06 MRN: [id number]  Date of admission: 01/18/2021 Delivery date:01/19/2021  Delivering provider: Noralyn Pick  Date of discharge: 01/21/2021  Admitting diagnosis: Normal labor and delivery [O80] Intrauterine pregnancy: [redacted]w[redacted]d    Secondary diagnosis:  Active Problems:   SVD (spontaneous vaginal delivery)   Normal postpartum course   Preterm premature rupture of membranes (PPROM) delivered, current hospitalization   Acute blood loss anemia   Preterm delivery  Additional problems: none    Discharge diagnosis: Preterm Pregnancy Delivered and Acute bloss loss anemia                                               Post partum procedures: none Augmentation: Pitocin Complications: None  Hospital course: Premature rupture of membranes without labor followed by Vaginal Delivery      35y.o. yo G3P0212 at 367w2das admitted in Latent Labor on 01/18/2021. Patient had an uncomplicated labor course as follows:  Membrane Rupture Time/Date:  ,   Delivery Method:Vaginal, Spontaneous  Episiotomy: None  Lacerations:  None  Patient had an uncomplicated postpartum course.  She is ambulating, tolerating a regular diet, passing flatus, and urinating well. Patient is discharged home in stable condition on 01/21/21.  Newborn Data: Birth date:01/19/2021  Birth time:9:26 PM  Gender:Female  Living status:Living  Apgars:9 ,9  Weight:2041 g   Magnesium Sulfate received: No BMZ received: No Rhophylac:No MMR:No Transfusion:No  Physical exam  Vitals:   01/20/21 0528 01/20/21 0837 01/20/21 1327 01/21/21 0449  BP: 111/66 98/77 114/72 119/79  Pulse: (!) 53 (!) 58 70 (!) 59  Resp: _0 Temp:  97.8 F (36.6 C) (!) 97.4 F (36.3 C) 97.7 F (36.5 C)  TempSrc:  Oral Oral Oral  SpO2: 100% 99% 100% 100%  Weight:      Height:       General: alert, cooperative, and no  distress Lochia: appropriate Uterine Fundus: firm Incision: N/A DVT Evaluation: No evidence of DVT seen on physical exam. No cords or calf tenderness. No significant calf/ankle edema. Labs: Lab Results  Component Value Date   WBC 15.6 (H) 01/20/2021   HGB 9.5 (L) 01/20/2021   HCT 30.7 (L) 01/20/2021   MCV 87.0 01/20/2021   PLT 225 01/20/2021   No flowsheet data found. Edinburgh Score: No flowsheet data found.    After visit meds:  Allergies as of 01/21/2021   No Known Allergies      Medication List     STOP taking these medications    meloxicam 15 MG tablet Commonly known as: MOBIC       TAKE these medications    acetaminophen 325 MG tablet Commonly known as: Tylenol Take 2 tablets (650 mg total) by mouth every 4 (four) hours as needed (for pain scale < 4).   ibuprofen 600 MG tablet Commonly known as: ADVIL Take 1 tablet (600 mg total) by mouth every 6 (six) hours.   iron polysaccharides 150 MG capsule Commonly known as: NIFEREX Take 1 capsule (150 mg total) by mouth daily. Start taking on: January 22, 2021   oxyCODONE-acetaminophen 5-325 MG tablet Commonly known as: PERCOCET/ROXICET Take 1-2 tablets by mouth every 8 (eight) hours as needed for up to 3 days for severe pain or moderate  pain.   Prenate Mini 18-0.6-0.4-350 MG Caps Take 1 capsule by mouth daily.         Discharge home in stable condition Infant Feeding: Bottle Infant Disposition:rooming in Discharge instruction: per After Visit Summary and Postpartum booklet. Activity: Advance as tolerated. Pelvic rest for 6 weeks.  Diet: iron rich diet Anticipated Birth Control: Plans Interval BTL Postpartum Appointment:6 weeks Additional Postpartum F/U:  none Future Appointments:No future appointments. Follow up Visit:  Follow-up Information     Drema Dallas, DO. Schedule an appointment as soon as possible for a visit in 6 week(s).   Specialty: Obstetrics and Gynecology Contact  information: [address] [place] [address] [phone]                     01/21/2021 Arrie Eastern, CNM

## 2021-01-22 ENCOUNTER — Inpatient Hospital Stay (HOSPITAL_COMMUNITY)
Admission: AD | Admit: 2021-01-22 | Payer: Commercial Managed Care - PPO | Source: Home / Self Care | Admitting: Obstetrics and Gynecology

## 2021-01-22 ENCOUNTER — Inpatient Hospital Stay (HOSPITAL_COMMUNITY): Payer: Commercial Managed Care - PPO

## 2021-01-23 LAB — T.PALLIDUM AB, TOTAL: T Pallidum Abs: REACTIVE — AB

## 2021-01-23 LAB — SURGICAL PATHOLOGY

## 2021-01-24 ENCOUNTER — Ambulatory Visit: Payer: Commercial Managed Care - PPO

## 2021-01-24 ENCOUNTER — Other Ambulatory Visit: Payer: Self-pay

## 2021-01-31 ENCOUNTER — Telehealth (HOSPITAL_COMMUNITY): Payer: Self-pay | Admitting: *Deleted

## 2021-01-31 NOTE — Telephone Encounter (Signed)
Attempted Hospital Discharge Follow-Up Call.  Left voice mail requesting that patient return RN's phone call.  

## 2021-05-07 NOTE — H&P (Signed)
Tina Larsen is an 35 y.o. [id number] who is admitted for Laparoscopic bilateral salpingectomy for desire for permanent sterilization.   She is 100% positive that she does not desire future child-bearing. She understands that this is a permanent procedure. She has declined LARCs and vasectomy.  She is s/p SVD 01/19/2021.  Patient Active Problem List   Diagnosis Date Noted   Acute blood loss anemia 01/20/2021   Preterm delivery 01/20/2021   SVD (spontaneous vaginal delivery) 01/19/2021   Normal postpartum course 01/19/2021   Preterm premature rupture of membranes (PPROM) delivered, current hospitalization 01/19/2021    MEDICAL/FAMILY/SOCIAL HX: No LMP recorded.    Past Medical History:  Diagnosis Date   Acute blood loss anemia 01/20/2021   Headache    Migraine     No past surgical history on file.  Family History  Problem Relation Age of Onset   Cancer Neg Hx    Diabetes Neg Hx    Hearing loss Neg Hx    Hypertension Neg Hx     Social History:  reports that she has never smoked. She has never used smokeless tobacco. She reports that she does not drink alcohol and does not use drugs.  ALLERGIES/MEDS:  Allergies: No Known Allergies  No medications prior to admission.     Review of Systems  Constitutional: Negative.   HENT: Negative.    Eyes: Negative.   Respiratory: Negative.    Cardiovascular: Negative.   Gastrointestinal: Negative.   Genitourinary: Negative.   Musculoskeletal: Negative.   Skin: Negative.   Neurological: Negative.   Endo/Heme/Allergies: Negative.   Psychiatric/Behavioral: Negative.     unknown if currently breastfeeding. Gen:  NAD, pleasant and cooperative Cardio:  RRR Pulm:  CTAB, no wheezes/rales/rhonchi Abd:  Soft, non-distended, non-tender throughout, no rebound/guarding Ext:  No bilateral LE edema, no bilateral calf tenderness  No results found for this or any previous visit (from the past 24 hour(s)).  No results  found.   ASSESSMENT/PLAN: Tina Larsen is a 35 y.o. [id number] who is admitted for Laparoscopic bilateral salpingectomy.  - Admit to [facility] - Admit labs (CBC, T&S, COVID screen) - Diet:  NPO - IVF:  Per anesthesia - VTE Prophylaxis:  SCDs - Antibiotics: None - D/C home same-day  Consents: Patient was counseled on the risks, benefits and alternatives of bilateral tubal sterilization.  Like any surgery this procedure carries risks including to but not limited to infection, damage to surrounding structures requiring additional surgery, thromboembolism, and even death.  She understands that there is a failure rate of approximently 1-2%, and if she were to get pregnant after a bilateral tubal sterilization, there is a 50% chance of ectopic pregnancy.  She was advised that if she should miss her period andor have a positive pregnancy test after her bilateral tubal sterilization, she should seek medical assistance.  She is aware that this is a permanent procedure and that reversal is available but is very expensive and only 50% effective.  I have discussed with the patient that this surgery is performed to provide permanent female sterilization by removing the entirety of each fallopian tube.  Prior to surgery, the risks and benefits of the surgery, as well as alternative treatments, have been discussed.  The risks of proceeding with this surgery include, but are not limited to, bleeding, including the need for a blood transfusion, infection, damage to surrounding organs and tissues, damage to bladder, damage to ureters, causing kidney damage, and requiring additional procedures, damage to bowels, resulting in  further surgery, postoperative pain, short-term and long-term, scarring on the abdominal wall and intra-abdominally, need for further surgery, development of an incisional hernia, need for an open procedure, deep vein thrombosis and/or pulmonary embolism, wound infection and/or separation, painful  intercourse, failure of the procedure to prevent pregnancy, increased risk of ectopic pregnancy requiring surgery if procedure failure occurs complications the course of which cannot be predicted or prevented, and death. Patient was consented for blood products.  The patient is aware that bleeding may result in the need for a blood transfusion which includes risk of transmission of HIV (1:2 million), Hepatitis C (1:2 million), and Hepatitis B (1:200 thousand) and transfusion reaction.  Patient voiced understanding of the above risks as well as understanding of indications for blood transfusion.    Steva Ready, DO [phone] (office)

## 2021-05-11 ENCOUNTER — Other Ambulatory Visit: Payer: Self-pay

## 2021-05-11 ENCOUNTER — Encounter (HOSPITAL_BASED_OUTPATIENT_CLINIC_OR_DEPARTMENT_OTHER): Payer: Self-pay | Admitting: Obstetrics and Gynecology

## 2021-05-18 ENCOUNTER — Encounter (HOSPITAL_BASED_OUTPATIENT_CLINIC_OR_DEPARTMENT_OTHER)
Admission: RE | Admit: 2021-05-18 | Discharge: 2021-05-18 | Disposition: A | Discharge status: Home / Self Care | Payer: BLUE CROSS/BLUE SHIELD | Source: Ambulatory Visit | Attending: Obstetrics and Gynecology | Admitting: Obstetrics and Gynecology

## 2021-05-18 DIAGNOSIS — E669 Obesity, unspecified: Secondary | ICD-10-CM | POA: Diagnosis not present

## 2021-05-18 DIAGNOSIS — R519 Headache, unspecified: Secondary | ICD-10-CM | POA: Diagnosis not present

## 2021-05-18 DIAGNOSIS — Z6836 Body mass index (BMI) 36.0-36.9, adult: Secondary | ICD-10-CM | POA: Diagnosis not present

## 2021-05-18 DIAGNOSIS — N83202 Unspecified ovarian cyst, left side: Secondary | ICD-10-CM | POA: Diagnosis not present

## 2021-05-18 DIAGNOSIS — Z302 Encounter for sterilization: Secondary | ICD-10-CM | POA: Diagnosis present

## 2021-05-18 LAB — CBC
HCT: 39.4 % (ref 36.0–46.0)
Hemoglobin: 12.2 g/dL (ref 12.0–15.0)
MCH: 26.2 pg (ref 26.0–34.0)
MCHC: 31 g/dL (ref 30.0–36.0)
MCV: 84.5 fL (ref 80.0–100.0)
Platelets: 256 10*3/uL (ref 150–400)
RBC: 4.66 MIL/uL (ref 3.87–5.11)
RDW: 13 % (ref 11.5–15.5)
WBC: 5.9 10*3/uL (ref 4.0–10.5)
nRBC: 0 % (ref 0.0–0.2)

## 2021-05-18 LAB — TYPE AND SCREEN
ABO/RH(D): O POS
Antibody Screen: NEGATIVE

## 2021-05-18 NOTE — Progress Notes (Signed)

## 2021-05-23 ENCOUNTER — Ambulatory Visit (HOSPITAL_BASED_OUTPATIENT_CLINIC_OR_DEPARTMENT_OTHER): Payer: BLUE CROSS/BLUE SHIELD | Admitting: Anesthesiology

## 2021-05-23 ENCOUNTER — Other Ambulatory Visit: Payer: Self-pay

## 2021-05-23 ENCOUNTER — Ambulatory Visit (HOSPITAL_BASED_OUTPATIENT_CLINIC_OR_DEPARTMENT_OTHER)
Admission: RE | Admit: 2021-05-23 | Discharge: 2021-05-23 | Disposition: A | Discharge status: Home / Self Care | Payer: BLUE CROSS/BLUE SHIELD | Attending: Obstetrics and Gynecology | Admitting: Obstetrics and Gynecology

## 2021-05-23 ENCOUNTER — Encounter (HOSPITAL_BASED_OUTPATIENT_CLINIC_OR_DEPARTMENT_OTHER): Payer: Self-pay | Admitting: Obstetrics and Gynecology

## 2021-05-23 ENCOUNTER — Encounter (HOSPITAL_BASED_OUTPATIENT_CLINIC_OR_DEPARTMENT_OTHER)
Admission: RE | Disposition: A | Discharge status: Home / Self Care | Payer: Self-pay | Source: Home / Self Care | Attending: Obstetrics and Gynecology

## 2021-05-23 DIAGNOSIS — Z6836 Body mass index (BMI) 36.0-36.9, adult: Secondary | ICD-10-CM | POA: Insufficient documentation

## 2021-05-23 DIAGNOSIS — N83202 Unspecified ovarian cyst, left side: Secondary | ICD-10-CM | POA: Insufficient documentation

## 2021-05-23 DIAGNOSIS — Z302 Encounter for sterilization: Secondary | ICD-10-CM | POA: Insufficient documentation

## 2021-05-23 DIAGNOSIS — R519 Headache, unspecified: Secondary | ICD-10-CM | POA: Insufficient documentation

## 2021-05-23 DIAGNOSIS — E669 Obesity, unspecified: Secondary | ICD-10-CM | POA: Insufficient documentation

## 2021-05-23 HISTORY — PX: LAPAROSCOPIC BILATERAL SALPINGECTOMY: SHX5889

## 2021-05-23 LAB — POCT PREGNANCY, URINE: Preg Test, Ur: NEGATIVE

## 2021-05-23 SURGERY — SALPINGECTOMY, BILATERAL, LAPAROSCOPIC
Anesthesia: General | Laterality: Bilateral

## 2021-05-23 MED ORDER — OXYCODONE HCL 5 MG PO TABS
5.0000 mg | ORAL_TABLET | Freq: Once | ORAL | Status: AC | PRN
  Administered 2021-05-23: 16:00:00 5 mg via ORAL

## 2021-05-23 MED ORDER — MEPERIDINE HCL 25 MG/ML IJ SOLN: 6.2500 mg | INTRAMUSCULAR | Status: DC | PRN

## 2021-05-23 MED ORDER — ONDANSETRON HCL 4 MG/2ML IJ SOLN
INTRAMUSCULAR | Status: DC | PRN
  Administered 2021-05-23: 4 mg via INTRAVENOUS

## 2021-05-23 MED ORDER — DEXAMETHASONE SODIUM PHOSPHATE 4 MG/ML IJ SOLN
INTRAMUSCULAR | Status: DC | PRN
  Administered 2021-05-23: 5 mg via INTRAVENOUS

## 2021-05-23 MED ORDER — SODIUM CHLORIDE 0.9 % IR SOLN
Status: DC | PRN
  Administered 2021-05-23: 1

## 2021-05-23 MED ORDER — HYDROMORPHONE HCL 1 MG/ML IJ SOLN
INTRAMUSCULAR | Status: AC
  Filled 2021-05-23: qty 0.5

## 2021-05-23 MED ORDER — MIDAZOLAM HCL 5 MG/5ML IJ SOLN
INTRAMUSCULAR | Status: DC | PRN
  Administered 2021-05-23: 2 mg via INTRAVENOUS

## 2021-05-23 MED ORDER — LACTATED RINGERS IV SOLN: INTRAVENOUS | Status: DC

## 2021-05-23 MED ORDER — FENTANYL CITRATE (PF) 100 MCG/2ML IJ SOLN
INTRAMUSCULAR | Status: AC
  Filled 2021-05-23: qty 2

## 2021-05-23 MED ORDER — MIDAZOLAM HCL 2 MG/2ML IJ SOLN
INTRAMUSCULAR | Status: AC
  Filled 2021-05-23: qty 2

## 2021-05-23 MED ORDER — ROCURONIUM BROMIDE 100 MG/10ML IV SOLN
INTRAVENOUS | Status: DC | PRN
  Administered 2021-05-23: 90 mg via INTRAVENOUS

## 2021-05-23 MED ORDER — LIDOCAINE 2% (20 MG/ML) 5 ML SYRINGE
INTRAMUSCULAR | Status: AC
  Filled 2021-05-23: qty 5

## 2021-05-23 MED ORDER — ROCURONIUM BROMIDE 10 MG/ML (PF) SYRINGE
PREFILLED_SYRINGE | INTRAVENOUS | Status: AC
  Filled 2021-05-23: qty 10

## 2021-05-23 MED ORDER — SUGAMMADEX SODIUM 500 MG/5ML IV SOLN
INTRAVENOUS | Status: DC | PRN
  Administered 2021-05-23: 370 mg via INTRAVENOUS

## 2021-05-23 MED ORDER — DEXAMETHASONE SODIUM PHOSPHATE 10 MG/ML IJ SOLN
INTRAMUSCULAR | Status: AC
  Filled 2021-05-23: qty 1

## 2021-05-23 MED ORDER — PROPOFOL 10 MG/ML IV BOLUS
INTRAVENOUS | Status: DC | PRN
  Administered 2021-05-23: 180 mg via INTRAVENOUS

## 2021-05-23 MED ORDER — OXYCODONE HCL 5 MG/5ML PO SOLN: 5.0000 mg | Freq: Once | ORAL | Status: AC | PRN

## 2021-05-23 MED ORDER — TRAMADOL HCL 50 MG PO TABS: 50.0000 mg | ORAL_TABLET | Freq: Four times a day (QID) | ORAL | 0 refills | Status: AC | PRN

## 2021-05-23 MED ORDER — AMISULPRIDE (ANTIEMETIC) 5 MG/2ML IV SOLN: 10.0000 mg | Freq: Once | INTRAVENOUS | Status: DC | PRN

## 2021-05-23 MED ORDER — IBUPROFEN 800 MG PO TABS: 800.0000 mg | ORAL_TABLET | Freq: Three times a day (TID) | ORAL | 0 refills | Status: AC | PRN

## 2021-05-23 MED ORDER — SUGAMMADEX SODIUM 500 MG/5ML IV SOLN
INTRAVENOUS | Status: AC
  Filled 2021-05-23: qty 5

## 2021-05-23 MED ORDER — PROMETHAZINE HCL 25 MG/ML IJ SOLN: 6.2500 mg | INTRAMUSCULAR | Status: DC | PRN

## 2021-05-23 MED ORDER — HYDROMORPHONE HCL 1 MG/ML IJ SOLN
0.2500 mg | INTRAMUSCULAR | Status: DC | PRN
  Administered 2021-05-23 (×2): 0.5 mg via INTRAVENOUS

## 2021-05-23 MED ORDER — BUPIVACAINE HCL (PF) 0.25 % IJ SOLN
INTRAMUSCULAR | Status: DC | PRN
  Administered 2021-05-23: 30 mL

## 2021-05-23 MED ORDER — OXYCODONE HCL 5 MG PO TABS
ORAL_TABLET | ORAL | Status: AC
  Filled 2021-05-23: qty 1

## 2021-05-23 MED ORDER — FENTANYL CITRATE (PF) 100 MCG/2ML IJ SOLN
INTRAMUSCULAR | Status: DC | PRN
  Administered 2021-05-23: 100 ug via INTRAVENOUS
  Administered 2021-05-23: 25 ug via INTRAVENOUS

## 2021-05-23 MED ORDER — LIDOCAINE HCL (CARDIAC) PF 100 MG/5ML IV SOSY
PREFILLED_SYRINGE | INTRAVENOUS | Status: DC | PRN
  Administered 2021-05-23: 80 mg via INTRAVENOUS

## 2021-05-23 MED ORDER — ONDANSETRON HCL 4 MG/2ML IJ SOLN
INTRAMUSCULAR | Status: AC
  Filled 2021-05-23: qty 2

## 2021-05-23 MED ORDER — PROPOFOL 10 MG/ML IV BOLUS
INTRAVENOUS | Status: AC
  Filled 2021-05-23: qty 20

## 2021-05-23 SURGICAL SUPPLY — 24 items
ADH SKN CLS APL DERMABOND .7 (GAUZE/BANDAGES/DRESSINGS) ×1
DERMABOND ADVANCED (GAUZE/BANDAGES/DRESSINGS) ×2
DERMABOND ADVANCED .7 DNX12 (GAUZE/BANDAGES/DRESSINGS) ×1 IMPLANT
DURAPREP 26ML APPLICATOR (WOUND CARE) ×3 IMPLANT
GAUZE 4X4 16PLY ~~LOC~~+RFID DBL (SPONGE) ×3 IMPLANT
GLOVE SURG ENC MOIS LTX SZ6.5 (GLOVE) ×6 IMPLANT
GLOVE SURG UNDER POLY LF SZ6.5 (GLOVE) ×3 IMPLANT
GLOVE SURG UNDER POLY LF SZ7 (GLOVE) ×6 IMPLANT
GOWN STRL REUS W/ TWL LRG LVL3 (GOWN DISPOSABLE) ×2 IMPLANT
GOWN STRL REUS W/TWL LRG LVL3 (GOWN DISPOSABLE) ×12
IRRIG SUCT STRYKERFLOW 2 WTIP (MISCELLANEOUS) ×3
IRRIGATION SUCT STRKRFLW 2 WTP (MISCELLANEOUS) IMPLANT
LIGASURE VESSEL 5MM BLUNT TIP (ELECTROSURGICAL) ×3 IMPLANT
PACK TRENDGUARD 450 HYBRID PRO (MISCELLANEOUS) IMPLANT
PAD OB MATERNITY 4.3X12.25 (PERSONAL CARE ITEMS) ×3 IMPLANT
SET TUBE SMOKE EVAC HIGH FLOW (TUBING) ×3 IMPLANT
SLEEVE ENDOPATH XCEL 5M (ENDOMECHANICALS) ×6 IMPLANT
SUT VICRYL 4-0 PS2 18IN ABS (SUTURE) ×3 IMPLANT
TOWEL GREEN STERILE FF (TOWEL DISPOSABLE) ×4 IMPLANT
TRAP SPECIMEN MUCUS 40CC (MISCELLANEOUS) ×2 IMPLANT
TRAY FOLEY W/BAG SLVR 14FR LF (SET/KITS/TRAYS/PACK) ×3 IMPLANT
TRENDGUARD 450 HYBRID PRO PACK (MISCELLANEOUS) ×3
TROCAR XCEL NON-BLD 5MMX100MML (ENDOMECHANICALS) ×3 IMPLANT
WARMER LAPAROSCOPE (MISCELLANEOUS) ×3 IMPLANT

## 2021-05-23 NOTE — Transfer of Care (Signed)
Immediate Anesthesia Transfer of Care Note  Patient: Tina Larsen  Procedure(s) Performed: LAPAROSCOPIC BILATERAL SALPINGECTOMY, DRAINAGE OF LEFT SIMPLE OVARIAN CYST (Bilateral)  Patient Location: PACU  Anesthesia Type:General  Level of Consciousness: drowsy  Airway & Oxygen Therapy: Patient Spontanous Breathing and Patient connected to face mask oxygen  Post-op Assessment: Report given to RN and Post -op Vital signs reviewed and stable  Post vital signs: Reviewed and stable  Last Vitals:  Vitals Value Taken Time  BP 130/57 05/23/21 1513  Temp    Pulse 84 05/23/21 1514  Resp 29 05/23/21 1514  SpO2 99 % 05/23/21 1514  Vitals shown include unvalidated device data.  Last Pain:  Vitals:   05/23/21 1237  TempSrc: Oral  PainSc: 0-No pain      Patients Stated Pain Goal: 3 (05/23/21 1237)  Complications: No notable events documented.

## 2021-05-23 NOTE — Anesthesia Preprocedure Evaluation (Addendum)
Anesthesia Evaluation  Patient identified by MRN, date of birth, ID band Patient awake    Reviewed: Allergy & Precautions, NPO status , Patient's Chart, lab work & pertinent test results  History of Anesthesia Complications Negative for: history of anesthetic complications  Airway Mallampati: II  TM Distance: >3 FB Neck ROM: Full    Dental no notable dental hx.    Pulmonary neg pulmonary ROS,    Pulmonary exam normal breath sounds clear to auscultation       Cardiovascular negative cardio ROS Normal cardiovascular exam Rhythm:Regular Rate:Normal     Neuro/Psych  Headaches, negative psych ROS   GI/Hepatic negative GI ROS, Neg liver ROS,   Endo/Other   Obesity   Renal/GU negative Renal ROS     Musculoskeletal negative musculoskeletal ROS (+)   Abdominal (+) + obese,   Peds  Hematology negative hematology ROS (+)   Anesthesia Other Findings   Reproductive/Obstetrics                             Anesthesia Physical  Anesthesia Plan  ASA: 2  Anesthesia Plan: General   Post-op Pain Management:    Induction: Intravenous  PONV Risk Score and Plan: 3 and Treatment may vary due to age or medical condition, Ondansetron, Dexamethasone and Midazolam  Airway Management Planned: Oral ETT  Additional Equipment: None  Intra-op Plan:   Post-operative Plan: Extubation in OR  Informed Consent: I have reviewed the patients History and Physical, chart, labs and discussed the procedure including the risks, benefits and alternatives for the proposed anesthesia with the patient or authorized representative who has indicated his/her understanding and acceptance.       Plan Discussed with: Anesthesiologist  Anesthesia Plan Comments:         Anesthesia Quick Evaluation

## 2021-05-23 NOTE — Anesthesia Procedure Notes (Signed)
Procedure Name: Intubation Date/Time: 05/23/2021 2:06 PM Performed by: Lavonia Dana, CRNA Pre-anesthesia Checklist: Patient identified, Emergency Drugs available, Suction available and Patient being monitored Patient Re-evaluated:Patient Re-evaluated prior to induction Oxygen Delivery Method: Circle system utilized Preoxygenation: Pre-oxygenation with 100% oxygen Induction Type: IV induction Ventilation: Mask ventilation without difficulty Laryngoscope Size: Mac and 3 Grade View: Grade I Tube type: Oral Tube size: 7.0 mm Number of attempts: 1 Airway Equipment and Method: Stylet and Oral airway Placement Confirmation: ETT inserted through vocal cords under direct vision, positive ETCO2 and breath sounds checked- equal and bilateral Secured at: 22 cm Tube secured with: Tape Dental Injury: Teeth and Oropharynx as per pre-operative assessment

## 2021-05-23 NOTE — Op Note (Addendum)
Pre Op Dx:   1. Desires permanent sterilization  Post Op Dx:   1. Desires permanent sterilization 2. Left, simple ovarian cyst 2. Fitz-Hugh Curtis Syndrome  Procedure:   1. Laparoscopic bilateral salpingectomy 2. Drainage of left ovarian cyst 3. Lysis of adhesions (less than 30 minutes)   Surgeon:  Dr. Steva Ready Assistants:  Dr. Gerald Leitz (assistant needed due to the complexity of the anatomy) Anesthesia:  General   EBL:  5cc  IVF:  See anesthesia documentation UOP:  See anesthesia documentation   Drains:  Foley catheter Specimen removed:  Bilateral fallopian tubes - sent to pathology   Pelvic washings - sent to cytology Device(s) implanted: None Case Type:  Clean Findings:  Normal-sized uterus and normal-appearing bilateral fallopian tubes. Left ovary with a ~5cm simple-appearing cyst with serous fluid. Right ovary appeared normal. Thin, filmy adhesions in the posterior cul-de-sac and Fitz Hugh-Curtis Syndrome (filmy, hepatic adhesions). Normal-appearing liver contour. Complications: None Indications:  35 y.o. [id number] who desired permanent sterilization. Description of each procedure:  After informed consent the patient was taken to the operating room where general endotracheal anesthesia was administered and found to be adequate. She was placed in dorsal lithotomy position. She was prepped and draped in the usual sterile fashion. An operative timeout was performed. A Hulka tenaculum was placed in the cervix and her bladder was drained. The abdomen was entered through an intraumbilical incision using a 21mm  trocar under direct visualization and a pneumoperitoneum was established. Bilateral lower quadrant 60mm ports were placed under direct visualization and atraumatic entry confirmed. The findings as above were noted. Pelvic washings obtained. The right fallopian tube was divided from the mesosalpinx using the Ligasure device. Adequate hemostasis noted. This process was completed on  the contralateral side. Hemostasis confirmed. Attempted to call patient's mother to consent for left ovarian cystectomy, possible oophorectomy; however, there was no answer. Given the benign-appearance of this cyst and inability to obtain consent, the cyst was punctured and opened and serous fluid was drained. Adequate hemostasis was noted. Filmy adhesions in the posterior cul-de-sac were taken down with the Ligasure device. The bilateral quadrant ports were withdrawn under visualization and the pneumoperitoneum was reduced completely, assisted with the patient having two deep breaths. The camera port was then withdrawn under visualization. The skin was closed with 4-0 Vicryl in subcuticular fashion. The tenaculum was removed and cervical hemostasis confirmed. The Hulka Tenaculum was removed and hemostasis was noted. Foley catheter removed. The patient was returned to dorsal supine position, awakened and extubated in the OR having appeared to tolerate the procedure well. All sponge, needle, and instrument counts were correct x 2 at the end of the case. Disposition:  PACU  Post-operative plan: Will perform [place] surveillance for recurrence of the simple-appearing left ovarian cyst.  Steva Ready, DO

## 2021-05-23 NOTE — Discharge Instructions (Signed)

## 2021-05-23 NOTE — Anesthesia Postprocedure Evaluation (Signed)
Anesthesia Post Note  Patient: Tina Larsen  Procedure(s) Performed: LAPAROSCOPIC BILATERAL SALPINGECTOMY, DRAINAGE OF LEFT SIMPLE OVARIAN CYST (Bilateral)     Patient location during evaluation: PACU Anesthesia Type: General Level of consciousness: awake and alert Pain management: pain level controlled Vital Signs Assessment: post-procedure vital signs reviewed and stable Respiratory status: spontaneous breathing, nonlabored ventilation and respiratory function stable Cardiovascular status: blood pressure returned to baseline and stable Postop Assessment: no apparent nausea or vomiting Anesthetic complications: no   No notable events documented.  Last Vitals:  Vitals:   05/23/21 1530 05/23/21 1545  BP:  118/77  Pulse: 74 (!) 57  Resp: 20 19  Temp:    SpO2: 96% 95%    Last Pain:  Vitals:   05/23/21 1545  TempSrc:   PainSc: 6                  Lowella Curb

## 2021-05-23 NOTE — Interval H&P Note (Signed)
History and Physical Interval Note:  05/23/2021 1:22 PM  Tina Larsen  has presented today for surgery, with the diagnosis of Sterilization.  The various methods of treatment have been discussed with the patient and family. After consideration of risks, benefits and other options for treatment, the patient has consented to  Procedure(s): LAPAROSCOPIC BILATERAL SALPINGECTOMY (Bilateral) as a surgical intervention.  The patient's history has been reviewed, patient examined, no change in status, stable for surgery.  I have reviewed the patient's chart and labs.  Questions were answered to the patient's satisfaction.     Steva Ready

## 2021-05-24 ENCOUNTER — Encounter (HOSPITAL_BASED_OUTPATIENT_CLINIC_OR_DEPARTMENT_OTHER): Payer: Self-pay | Admitting: Obstetrics and Gynecology

## 2021-05-24 LAB — CYTOLOGY - NON PAP

## 2021-05-24 LAB — SURGICAL PATHOLOGY

## 2021-09-02 ENCOUNTER — Other Ambulatory Visit: Payer: Self-pay

## 2021-09-02 ENCOUNTER — Emergency Department (HOSPITAL_COMMUNITY): Payer: BLUE CROSS/BLUE SHIELD

## 2021-09-02 ENCOUNTER — Emergency Department (HOSPITAL_COMMUNITY)
Admission: EM | Admit: 2021-09-02 | Discharge: 2021-09-02 | Disposition: A | Discharge status: Home / Self Care | Payer: BLUE CROSS/BLUE SHIELD | Attending: Emergency Medicine | Admitting: Emergency Medicine

## 2021-09-02 ENCOUNTER — Encounter (HOSPITAL_COMMUNITY): Payer: Self-pay

## 2021-09-02 DIAGNOSIS — K219 Gastro-esophageal reflux disease without esophagitis: Secondary | ICD-10-CM | POA: Insufficient documentation

## 2021-09-02 DIAGNOSIS — R079 Chest pain, unspecified: Secondary | ICD-10-CM

## 2021-09-02 DIAGNOSIS — N9489 Other specified conditions associated with female genital organs and menstrual cycle: Secondary | ICD-10-CM | POA: Insufficient documentation

## 2021-09-02 LAB — CBC
HCT: 36.5 % (ref 36.0–46.0)
Hemoglobin: 11.1 g/dL — ABNORMAL LOW (ref 12.0–15.0)
MCH: 26.2 pg (ref 26.0–34.0)
MCHC: 30.4 g/dL (ref 30.0–36.0)
MCV: 86.1 fL (ref 80.0–100.0)
Platelets: 254 10*3/uL (ref 150–400)
RBC: 4.24 MIL/uL (ref 3.87–5.11)
RDW: 12.8 % (ref 11.5–15.5)
WBC: 7.9 10*3/uL (ref 4.0–10.5)
nRBC: 0 % (ref 0.0–0.2)

## 2021-09-02 LAB — TROPONIN I (HIGH SENSITIVITY)
Troponin I (High Sensitivity): 3 ng/L (ref <18)
Troponin I (High Sensitivity): 3 ng/L (ref <18)

## 2021-09-02 LAB — BASIC METABOLIC PANEL
Anion gap: 8 (ref 5–15)
BUN: 8 mg/dL (ref 6–20)
CO2: 24 mmol/L (ref 22–32)
Calcium: 9.1 mg/dL (ref 8.9–10.3)
Chloride: 108 mmol/L (ref 98–111)
Creatinine, Ser: 0.75 mg/dL (ref 0.44–1.00)
GFR, Estimated: >60 mL/min (ref >60)
Glucose, Bld: 105 mg/dL — ABNORMAL HIGH (ref 70–99)
Potassium: 3.3 mmol/L — ABNORMAL LOW (ref 3.5–5.1)
Sodium: 140 mmol/L (ref 135–145)

## 2021-09-02 LAB — I-STAT BETA HCG BLOOD, ED (MC, WL, AP ONLY): I-stat hCG, quantitative: <5 m[IU]/mL (ref <5)

## 2021-09-02 MED ORDER — SUCRALFATE 1 G PO TABS
1.0000 g | ORAL_TABLET | Freq: Once | ORAL | Status: AC
  Administered 2021-09-02: 1 g via ORAL
  Filled 2021-09-02: qty 1

## 2021-09-02 MED ORDER — PANTOPRAZOLE SODIUM 40 MG PO TBEC: 40.0000 mg | DELAYED_RELEASE_TABLET | Freq: Every day | ORAL | 0 refills | Status: AC

## 2021-09-02 MED ORDER — FAMOTIDINE IN NACL 20-0.9 MG/50ML-% IV SOLN
20.0000 mg | Freq: Once | INTRAVENOUS | Status: AC
  Administered 2021-09-02: 20 mg via INTRAVENOUS
  Filled 2021-09-02: qty 50

## 2021-09-02 MED ORDER — ONDANSETRON HCL 4 MG/2ML IJ SOLN
4.0000 mg | Freq: Once | INTRAMUSCULAR | Status: AC
  Administered 2021-09-02: 4 mg via INTRAVENOUS
  Filled 2021-09-02: qty 2

## 2021-09-02 MED ORDER — PROCHLORPERAZINE EDISYLATE 10 MG/2ML IJ SOLN
10.0000 mg | Freq: Once | INTRAMUSCULAR | Status: AC
  Administered 2021-09-02: 10 mg via INTRAVENOUS
  Filled 2021-09-02: qty 2

## 2021-09-02 MED ORDER — ALUM & MAG HYDROXIDE-SIMETH 200-200-20 MG/5ML PO SUSP
30.0000 mL | Freq: Once | ORAL | Status: AC
  Administered 2021-09-02: 30 mL via ORAL
  Filled 2021-09-02: qty 30

## 2021-09-02 NOTE — ED Provider Notes (Signed)
?[facility] EMERGENCY DEPARTMENT ?Provider Note ? ? ?CSN: [id number] ?Arrival date & time: 09/02/21  1755 ? ?  ? ?History ? ?Chief Complaint  ?Patient presents with  ? Chest Pain  ? ? ?Tina Larsen is a 36 y.o. female.  She is here with upper abdominal pain radiating to her chest burning in nature that started around 3 PM today after she got off of work.  She said she gets these symptoms about once a week.  She sometimes try some baking soda and sometimes uses a Prevacid.  She usually vomits these up and they do not seem to really help much.  She does not have a PCP and has never seen GI.  She denies any NSAID use, alcohol use, tobacco. ? ?The history is provided by the patient.  ?Chest Pain ?Pain location:  Substernal area and epigastric ?Pain quality: burning   ?Pain severity:  Severe ?Onset quality:  Gradual ?Duration:  3 hours ?Timing:  Constant ?Progression:  Unchanged ?Chronicity:  Recurrent ?Relieved by:  Nothing ?Worsened by:  Nothing ?Ineffective treatments:  Antacids ?Associated symptoms: abdominal pain, nausea and vomiting   ?Associated symptoms: no cough, no diaphoresis, no fever, no headache and no shortness of breath   ? ?  ? ?Home Medications ?Prior to Admission medications   ?Medication Sig Start Date End Date Taking? Authorizing Provider  ?acetaminophen (TYLENOL) 325 MG tablet Take 2 tablets (650 mg total) by mouth every 4 (four) hours as needed (for pain scale < 4). 01/21/21   Roma Schanz, CNM  ?ibuprofen (ADVIL) 800 MG tablet Take 1 tablet (800 mg total) by mouth every 8 (eight) hours as needed. 05/23/21   Steva Ready, DO  ?traMADol (ULTRAM) 50 MG tablet Take 1 tablet (50 mg total) by mouth every 6 (six) hours as needed for moderate pain or severe pain. 05/23/21   Steva Ready, DO  ?   ? ?Allergies    ?Patient has no known allergies.   ? ?Review of Systems   ?Review of Systems  ?Constitutional:  Negative for diaphoresis and fever.  ?HENT:  Negative for sore throat.    ?Respiratory:  Negative for cough and shortness of breath.   ?Cardiovascular:  Positive for chest pain.  ?Gastrointestinal:  Positive for abdominal pain, nausea and vomiting.  ?Musculoskeletal:  Negative for neck pain.  ?Neurological:  Negative for headaches.  ? ?Physical Exam ?Updated Vital Signs ?BP 136/88 (BP Location: Right Arm)   Pulse 77   Temp 98.5 ?F (36.9 ?C) (Oral)   Resp 18   Ht 5\' 2"  (1.575 m)   Wt 91.6 kg   LMP 08/05/2021 (Approximate)   SpO2 100%   Breastfeeding No   BMI 36.95 kg/m?  ?Physical Exam ?Vitals and nursing note reviewed.  ?Constitutional:   ?   General: She is not in acute distress. ?   Appearance: Normal appearance. She is well-developed.  ?HENT:  ?   Head: Normocephalic and atraumatic.  ?Eyes:  ?   Conjunctiva/sclera: Conjunctivae normal.  ?Cardiovascular:  ?   Rate and Rhythm: Normal rate and regular rhythm.  ?   Heart sounds: Normal heart sounds. No murmur heard. ?Pulmonary:  ?   Effort: Pulmonary effort is normal. No respiratory distress.  ?   Breath sounds: Normal breath sounds.  ?Abdominal:  ?   Palpations: Abdomen is soft. There is no mass.  ?   Tenderness: There is no abdominal tenderness. There is no guarding or rebound.  ?Musculoskeletal:     ?  General: No swelling.  ?   Cervical back: Neck supple.  ?   Right lower leg: No tenderness.  ?   Left lower leg: No tenderness.  ?Skin: ?   General: Skin is warm and dry.  ?   Capillary Refill: Capillary refill takes less than 2 seconds.  ?Neurological:  ?   General: No focal deficit present.  ?   Mental Status: She is alert.  ? ? ?ED Results / Procedures / Treatments   ?Labs ?(all labs ordered are listed, but only abnormal results are displayed) ?Labs Reviewed  ?BASIC METABOLIC PANEL - Abnormal; Notable for the following components:  ?    Result Value  ? Potassium 3.3 (*)   ? Glucose, Bld 105 (*)   ? All other components within normal limits  ?CBC - Abnormal; Notable for the following components:  ? Hemoglobin 11.1 (*)   ? All  other components within normal limits  ?I-STAT BETA HCG BLOOD, ED ([facility], [facility], [facility] ONLY)  ?TROPONIN I (HIGH SENSITIVITY)  ?TROPONIN I (HIGH SENSITIVITY)  ? ? ?EKG ?EKG Interpretation ? ?Date/Time:  Sunday September 02 2021 17:51:36 EDT ?Ventricular Rate:  73 ?PR Interval:  132 ?QRS Duration: 84 ?QT Interval:  438 ?QTC Calculation: 482 ?R Axis:   66 ?Text Interpretation: Normal sinus rhythm with sinus arrhythmia Prolonged QT Abnormal ECG No previous ECGs available Confirmed by Meridee Score [id number]) on 09/02/2021 6:23:09 PM ? ?Radiology ?DG Chest 2 View ? ?Result Date: 09/02/2021 ?CLINICAL DATA:  Chest pain EXAM: CHEST - 2 VIEW COMPARISON:  None. FINDINGS: The heart and mediastinal contours are within normal limits. No focal consolidation. No pulmonary edema. No pleural effusion. No pneumothorax. No acute osseous abnormality. IMPRESSION: No active cardiopulmonary disease. Electronically Signed   By: Tish Frederickson M.D.   On: 09/02/2021 18:49   ? ?Procedures ?Procedures  ? ? ?Medications Ordered in ED ?Medications  ?ondansetron (ZOFRAN) injection 4 mg (4 mg Intravenous Given 09/02/21 1856)  ?famotidine (PEPCID) IVPB 20 mg premix (0 mg Intravenous Stopped 09/02/21 1928)  ?sucralfate (CARAFATE) tablet 1 g (1 g Oral Given 09/02/21 1858)  ?prochlorperazine (COMPAZINE) injection 10 mg (10 mg Intravenous Given 09/02/21 1956)  ?alum & mag hydroxide-simeth (MAALOX/MYLANTA) 200-200-20 MG/5ML suspension 30 mL (30 mLs Oral Given 09/02/21 2038)  ? ? ?ED Course/ Medical Decision Making/ A&P ?Clinical Course as of 09/03/21 0945  ?Wynelle Link Sep 02, 2021  ?1823 EKG not crossing in epic.  Normal sinus rhythm rate 73 borderline QT no acute ST-T changes no prior EKGs to compare with. [MB]  ?1839 Chest x-ray interpreted by me as no acute infiltrates.  Awaiting radiology reading. [MB]  ?1946 Patient states she does not feel any better and she thinks she vomited up what we gave her. [MB]  ?  ?Clinical Course User Index ?[MB] Terrilee Files, MD  ? ?                         ?Medical Decision Making ?Amount and/or Complexity of Data Reviewed ?Labs: ordered. ?Radiology: ordered. ? ?Risk ?OTC drugs. ?Prescription drug management. ? ?This patient complains of burning chest pain; this involves an extensive number of treatment ?Options and is a complaint that carries with it a high risk of complications and ?morbidity. The differential includes ACS, pneumonia, PE, pneumothorax, GERD, musculoskeletal esophagitis perforation ? ?I ordered, reviewed and interpreted labs, which included CBC with normal white count, hemoglobin low stable from priors, chemistries with mildly low potassium, pregnancy test  negative, troponins flat ?I ordered medication IV acid medication and nausea medication, oral Maalox and Carafate and reviewed PMP when indicated. ?I ordered imaging studies which included chest x-ray and I independently ?   visualized and interpreted imaging which showed no acute findings ?Previous records obtained and reviewed no recent admissions ?Cardiac monitoring reviewed, normal sinus rhythm ?Social determinants considered, no significant barriers ?Critical Interventions: None ? ?After the interventions stated above, I reevaluated the patient and found patient to be symptomatically improved ?Admission and further testing considered, no indications for admission or further work-up at this time.  Patient agreeable plan to more consistently use PPI and will place referral to GI for her.  Return instructions discussed ? ? ? ? ? ? ? ? ? ?Final Clinical Impression(s) / ED Diagnoses ?Final diagnoses:  ?Nonspecific chest pain  ?Gastroesophageal reflux disease, unspecified whether esophagitis present  ? ? ?Rx / DC Orders ?ED Discharge Orders   ? ?      Ordered  ?  pantoprazole (PROTONIX) 40 MG tablet  Daily       ? 09/02/21 2145  ?  Ambulatory referral to Gastroenterology       ? 09/02/21 2145  ? ?  ?  ? ?  ? ? ?  ?Terrilee FilesButler, Zaliyah Meikle C, MD ?09/03/21 [phone] ? ?

## 2021-09-02 NOTE — ED Triage Notes (Signed)
Pt reports epigastric/chest pain she describes as severe burning that started today and radiates to her back associated with vomiting x 6 today ?

## 2021-09-02 NOTE — Discharge Instructions (Addendum)
Seen in the emergency department for burning chest pain.  Your lab work and EKG did not show any evidence of heart injury.  Your symptoms improved with some acid medication.  We are starting you on some acid medication which you should take daily.  We are also putting a referral in for you to see GI. ?

## 2021-10-04 ENCOUNTER — Encounter: Payer: Self-pay | Admitting: Emergency Medicine
